# Patient Record
Sex: Male | Born: 1995 | Race: Black or African American | Hispanic: No | Marital: Single | Smoking: Current every day smoker
Health system: Southern US, Community
[De-identification: ages and names within clinical notes are randomized; demographics above are authoritative.]

## PROBLEM LIST (undated history)

## (undated) DIAGNOSIS — B009 Herpesviral infection, unspecified: Secondary | ICD-10-CM

---
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
## 2013-01-19 ENCOUNTER — Emergency Department: Payer: Self-pay | Admitting: Emergency Medicine

## 2014-10-19 DIAGNOSIS — N4889 Other specified disorders of penis: Secondary | ICD-10-CM | POA: Insufficient documentation

## 2015-11-26 ENCOUNTER — Encounter: Payer: Self-pay | Admitting: *Deleted

## 2015-11-26 ENCOUNTER — Ambulatory Visit
Admission: EM | Admit: 2015-11-26 | Discharge: 2015-11-26 | Disposition: A | Discharge status: Home / Self Care | Payer: BLUE CROSS/BLUE SHIELD | Attending: Family Medicine | Admitting: Family Medicine

## 2015-11-26 DIAGNOSIS — N489 Disorder of penis, unspecified: Secondary | ICD-10-CM

## 2015-11-26 DIAGNOSIS — N4889 Other specified disorders of penis: Secondary | ICD-10-CM

## 2015-11-26 MED ORDER — VALACYCLOVIR HCL 1 G PO TABS: 1000.0000 mg | ORAL_TABLET | Freq: Two times a day (BID) | ORAL | Status: DC

## 2015-11-26 NOTE — ED Provider Notes (Signed)
CSN: [id number]     Arrival date & time 11/26/15  1701 History   First MD Initiated Contact with Patient 11/26/15 1812     Chief Complaint  Patient presents with  . Rash   (Consider location/radiation/quality/duration/timing/severity/associated sxs/prior Treatment) Patient is a 20 y.o. male presenting with rash. The history is provided by the patient.  Rash Location:  Ano-genital Ano-genital rash location:  Penis Quality: blistering, redness and scaling   Quality comment:  Initially blistering and redness 6 days ago, but now dry, scaly, scabbed Severity:  Moderate Onset quality:  Sudden Duration:  6 days Timing:  Constant Progression:  Unable to specify Chronicity:  New Ineffective treatments:  None tried   History reviewed. No pertinent past medical history. History reviewed. No pertinent past surgical history. History reviewed. No pertinent family history. Social History  Substance Use Topics  . Smoking status: Current Every Day Smoker  . Smokeless tobacco: Never Used  . Alcohol Use: Yes    Review of Systems  Skin: Positive for rash.    Allergies  Review of patient's allergies indicates no known allergies.  Home Medications   Prior to Admission medications   Medication Sig Start Date End Date Taking? Authorizing Provider  valACYclovir (VALTREX) 1000 MG tablet Take 1 tablet (1,000 mg total) by mouth 2 (two) times daily. 11/26/15   Seth Mccallum, MD   Meds Ordered and Administered this Visit  Medications - No data to display  BP 105/57 mmHg  Pulse 57  Temp(Src) 98.3 F (36.8 C) (Oral)  Resp 18  Ht  (1.727 m)  Wt 185 lb (83.915 kg)  BMI 28.14 kg/m2  SpO2 100% No data found.   Physical Exam  Constitutional: He appears well-developed and well-nourished. No distress.  Genitourinary: Testes normal. No penile erythema. No discharge found.  Dry, scaly, scabbed skin lesions, non-tender at the tip of the penis; no drainage  Skin: He is not diaphoretic.   Nursing note and vitals reviewed.   ED Course  Procedures (including critical care time)  Labs Review Labs Reviewed - No data to display  Imaging Review No results found.   Visual Acuity Review  Right Eye Distance:   Left Eye Distance:   Bilateral Distance:    Right Eye Near:   Left Eye Near:    Bilateral Near:         MDM   1. Penile lesion    Discharge Medication List as of 11/26/2015  6:24 PM    START taking these medications   Details  valACYclovir (VALTREX) 1000 MG tablet Take 1 tablet (1,000 mg total) by mouth 2 (two) times daily., Starting 11/26/2015, Until Discontinued, Normal       1. Diagnosis and possible etiologies reviewed with patient; possible herpes 2. rx as per orders above; reviewed possible side effects, interactions, risks and benefits; empiric treatment 3. Follow-up prn if symptoms worsen or don't improve    Seth Mccallum, MD 11/26/15 2101

## 2015-11-26 NOTE — ED Notes (Signed)
Patient reports a red rash on the right side of his penis that started 5 days ago. Additional symptom includes lower abdominal pressure.

## 2016-01-04 ENCOUNTER — Ambulatory Visit
Admission: EM | Admit: 2016-01-04 | Discharge: 2016-01-04 | Disposition: A | Discharge status: Home / Self Care | Payer: BLUE CROSS/BLUE SHIELD | Attending: Family Medicine | Admitting: Family Medicine

## 2016-01-04 ENCOUNTER — Encounter: Payer: Self-pay | Admitting: *Deleted

## 2016-01-04 DIAGNOSIS — J069 Acute upper respiratory infection, unspecified: Secondary | ICD-10-CM | POA: Diagnosis not present

## 2016-01-04 LAB — RAPID INFLUENZA A&B ANTIGENS
Influenza A (ARMC): NOT DETECTED — AB
Influenza B (ARMC): NOT DETECTED — AB

## 2016-01-04 LAB — RAPID STREP SCREEN (MED CTR MEBANE ONLY): Streptococcus, Group A Screen (Direct): NEGATIVE

## 2016-01-04 NOTE — ED Notes (Signed)
Sore throat, headache, and epigastric pain x2 days. Nausea and vomiting x1 at onset.

## 2016-01-04 NOTE — ED Provider Notes (Signed)
CSN: [id number]     Arrival date & time 01/04/16  1034 History   First MD Initiated Contact with Patient 01/04/16 1329     Chief Complaint  Patient presents with  . Sore Throat  . Headache  . Abdominal Pain   (Consider location/radiation/quality/duration/timing/severity/associated sxs/prior Treatment) Patient is a 20 y.o. male presenting with URI.  URI Presenting symptoms: congestion, cough, rhinorrhea and sore throat   Severity:  Mild Onset quality:  Sudden Duration:  2 days Timing:  Constant Progression:  Unchanged Chronicity:  New Relieved by:  None tried Worsened by:  Nothing tried Ineffective treatments:  None tried Associated symptoms: sneezing   Associated symptoms: no arthralgias, no headaches, no myalgias, no neck pain, no sinus pain and no wheezing   Risk factors: sick contacts   Risk factors: not elderly, no chronic cardiac disease, no chronic kidney disease, no chronic respiratory disease, no diabetes mellitus, no immunosuppression, no recent illness and no recent travel     History reviewed. No pertinent past medical history. History reviewed. No pertinent past surgical history. History reviewed. No pertinent family history. Social History  Substance Use Topics  . Smoking status: Current Every Day Smoker -- 0.50 packs/day    Types: Cigarettes  . Smokeless tobacco: Never Used  . Alcohol Use: Yes    Review of Systems  HENT: Positive for congestion, rhinorrhea, sneezing and sore throat.   Respiratory: Positive for cough. Negative for wheezing.   Musculoskeletal: Negative for myalgias, arthralgias and neck pain.  Neurological: Negative for headaches.    Allergies  Amoxicillin  Home Medications   Prior to Admission medications   Medication Sig Start Date End Date Taking? Authorizing Provider  valACYclovir (VALTREX) 1000 MG tablet Take 1 tablet (1,000 mg total) by mouth 2 (two) times daily. 11/26/15   Payton Mccallum, MD   Meds Ordered and Administered this  Visit  Medications - No data to display  BP 112/57 mmHg  Pulse 51  Temp(Src) 98 F (36.7 C) (Oral)  Resp 16  Ht  (1.753 m)  Wt 185 lb (83.915 kg)  BMI 27.31 kg/m2  SpO2 99% No data found.   Physical Exam  Constitutional: He appears well-developed and well-nourished. No distress.  HENT:  Head: Normocephalic and atraumatic.  Right Ear: Tympanic membrane, external ear and ear canal normal.  Left Ear: Tympanic membrane, external ear and ear canal normal.  Nose: Rhinorrhea present.  Mouth/Throat: Uvula is midline, oropharynx is clear and moist and mucous membranes are normal. No oropharyngeal exudate or tonsillar abscesses.  Eyes: Conjunctivae and EOM are normal. Pupils are equal, round, and reactive to light. Right eye exhibits no discharge. Left eye exhibits no discharge. No scleral icterus.  Neck: Normal range of motion. Neck supple. No tracheal deviation present. No thyromegaly present.  Cardiovascular: Normal rate, regular rhythm and normal heart sounds.   Pulmonary/Chest: Effort normal and breath sounds normal. No stridor. No respiratory distress. He has no wheezes. He has no rales. He exhibits no tenderness.  Lymphadenopathy:    He has no cervical adenopathy.  Neurological: He is alert.  Skin: Skin is warm and dry. No rash noted. He is not diaphoretic.  Nursing note and vitals reviewed.   ED Course  Procedures (including critical care time)  Labs Review Labs Reviewed  RAPID INFLUENZA A&B ANTIGENS ([facility] ONLY) - Abnormal; Notable for the following:    Influenza A [facility]) NOT DETECTED (*)    Influenza B ([facility]) NOT DETECTED (*)    All other components  within normal limits  RAPID STREP SCREEN (NOT AT [facility])  CULTURE, GROUP A STREP [facility])    Imaging Review No results found.   Visual Acuity Review  Right Eye Distance:   Left Eye Distance:   Bilateral Distance:    Right Eye Near:   Left Eye Near:    Bilateral Near:         MDM   1. Viral URI     Discharge Medication List as of 01/04/2016  2:13 PM     1. Lab results and diagnosis reviewed with patient 2. Recommend supportive treatment with fluids, rest, otc meds 3. Follow-up prn if symptoms worsen or don't improve    Payton Mccallum, MD 01/04/16 [phone]

## 2016-01-05 ENCOUNTER — Encounter: Payer: Self-pay | Admitting: *Deleted

## 2016-01-05 ENCOUNTER — Ambulatory Visit (INDEPENDENT_AMBULATORY_CARE_PROVIDER_SITE_OTHER): Payer: BLUE CROSS/BLUE SHIELD

## 2016-01-05 ENCOUNTER — Ambulatory Visit
Admission: EM | Admit: 2016-01-05 | Discharge: 2016-01-05 | Disposition: A | Discharge status: Home / Self Care | Payer: BLUE CROSS/BLUE SHIELD | Attending: Family Medicine | Admitting: Family Medicine

## 2016-01-05 DIAGNOSIS — K5900 Constipation, unspecified: Secondary | ICD-10-CM | POA: Diagnosis not present

## 2016-01-05 HISTORY — DX: Herpesviral infection, unspecified: B00.9

## 2016-01-05 LAB — CBC WITH DIFFERENTIAL/PLATELET
BASOS PCT: 0 %
Basophils Absolute: 0 10*3/uL (ref 0–0.1)
EOS ABS: 0 10*3/uL (ref 0–0.7)
Eosinophils Relative: 1 %
HCT: 45.1 % (ref 40.0–52.0)
HEMOGLOBIN: 15.1 g/dL (ref 13.0–18.0)
Lymphocytes Relative: 22 %
Lymphs Abs: 1.8 10*3/uL (ref 1.0–3.6)
MCH: 30.4 pg (ref 26.0–34.0)
MCHC: 33.5 g/dL (ref 32.0–36.0)
MCV: 90.7 fL (ref 80.0–100.0)
Monocytes Absolute: 0.7 10*3/uL (ref 0.2–1.0)
Monocytes Relative: 8 %
NEUTROS PCT: 69 %
Neutro Abs: 5.6 10*3/uL (ref 1.4–6.5)
PLATELETS: 179 10*3/uL (ref 150–440)
RBC: 4.98 MIL/uL (ref 4.40–5.90)
RDW: 14.1 % (ref 11.5–14.5)
WBC: 8.1 10*3/uL (ref 3.8–10.6)

## 2016-01-05 LAB — MONONUCLEOSIS SCREEN: MONO SCREEN: NEGATIVE

## 2016-01-05 NOTE — ED Provider Notes (Signed)
CSN: [id number]     Arrival date & time 01/05/16  0831 History   First MD Initiated Contact with Patient 01/05/16 0848     Chief Complaint  Patient presents with  . Abdominal Pain   (Consider location/radiation/quality/duration/timing/severity/associated sxs/prior Treatment) HPI: Patient presents today with symptoms of generalized abdominal discomfort. Patient states that he has had the symptoms for the last few days. He was seen here yesterday with symptoms of headache and sore throat. He did have a flu test and rapid strep test done in the office which were negative. He denies any fevers. He does state that his diet is poor and that he does drink alcohol on occasion and drinks mostly soda and juice. He has a bowel movement once every 2 days. He denies any vomiting, nausea, diarrhea, melena, BRBPR. He denies taking any medications. He has not been taking NSAIDs.  No past medical history on file. No past surgical history on file. No family history on file. Social History  Substance Use Topics  . Smoking status: Current Every Day Smoker -- 0.50 packs/day    Types: Cigarettes  . Smokeless tobacco: Never Used  . Alcohol Use: Yes    Review of Systems: Negative except mentioned above.   Allergies  Amoxicillin  Home Medications   Prior to Admission medications   Medication Sig Start Date End Date Taking? Authorizing Provider  valACYclovir (VALTREX) 1000 MG tablet Take 1 tablet (1,000 mg total) by mouth 2 (two) times daily. 11/26/15   Payton Mccallum, MD   Meds Ordered and Administered this Visit  Medications - No data to display  There were no vitals taken for this visit. No data found.   Physical Exam:   GENERAL: NAD HEENT: mild pharyngeal erythema, no exudate, no erythema of TMs, no significant cervical LAD RESP: CTA B CARD: RRR ABD: generalized abdominal discomfort, no rebound or guarding  NEURO: CN II-XII grossly intact   ED Course  Procedures (including critical care  time)  Labs Review Labs Reviewed - No data to display  Imaging Review No results found.   MDM  A/P: Constipation- discussed results of Xray with patient, labs normal, magnesium citrate 1-2x, encouraged dietary changes, increase water intake, f/u with PMD if symptoms persist or worsen.     Jolene Provost, MD 01/05/16 1140

## 2016-01-05 NOTE — ED Notes (Signed)
Patient started having stomach pain 4 days ago. Patient reports stomach pain has become worse since yesterday.

## 2016-01-05 NOTE — Discharge Instructions (Signed)
Try Magnesium Citrate for 1-2 days then stay on stool softner. Drink more water.

## 2016-01-06 LAB — CULTURE, GROUP A STREP (THRC)

## 2016-02-11 ENCOUNTER — Encounter: Payer: Self-pay | Admitting: Emergency Medicine

## 2016-02-11 ENCOUNTER — Ambulatory Visit
Admission: EM | Admit: 2016-02-11 | Discharge: 2016-02-11 | Disposition: A | Discharge status: Home / Self Care | Payer: BLUE CROSS/BLUE SHIELD | Attending: Family Medicine | Admitting: Family Medicine

## 2016-02-11 DIAGNOSIS — F419 Anxiety disorder, unspecified: Secondary | ICD-10-CM

## 2016-02-11 MED ORDER — LORAZEPAM 1 MG PO TABS: ORAL_TABLET | ORAL | Status: DC

## 2016-02-11 MED ORDER — SERTRALINE HCL 50 MG PO TABS: 50.0000 mg | ORAL_TABLET | Freq: Every day | ORAL | Status: DC

## 2016-02-11 NOTE — ED Provider Notes (Signed)
CSN: [id number]     Arrival date & time 02/11/16  1119 History   First MD Initiated Contact with Patient 02/11/16 1309     Chief Complaint  Patient presents with  . Anxiety   (Consider location/radiation/quality/duration/timing/severity/associated sxs/prior Treatment) HPI Comments: 20 yo male with a complaint of anxiety which has been worse recently due to increased stressors, causing some anxiety attacks with palpitations, increased breathing, sweating. Denies any suicidal or homicidal ideation. Does not have a PCP.   Patient is a 20 y.o. male presenting with anxiety. The history is provided by the patient.  Anxiety    Past Medical History  Diagnosis Date  . Herpes    History reviewed. No pertinent past surgical history. No family history on file. Social History  Substance Use Topics  . Smoking status: Current Every Day Smoker -- 0.50 packs/day    Types: Cigarettes  . Smokeless tobacco: Never Used  . Alcohol Use: Yes    Review of Systems  Allergies  Amoxicillin  Home Medications   Prior to Admission medications   Medication Sig Start Date End Date Taking? Authorizing Provider  LORazepam (ATIVAN) 1 MG tablet 1 tab po qd prn severe anxiety 02/11/16   Payton Mccallumrlando Rianne Degraaf, MD  sertraline (ZOLOFT) 50 MG tablet Take 1 tablet (50 mg total) by mouth daily. 02/11/16   Payton Mccallumrlando Tyrone Pautsch, MD  valACYclovir (VALTREX) 1000 MG tablet Take 1 tablet (1,000 mg total) by mouth 2 (two) times daily. 11/26/15   Payton Mccallumrlando Ainslee Sou, MD   Meds Ordered and Administered this Visit  Medications - No data to display  BP 111/67 mmHg  Pulse 60  Temp(Src) 96.3 F (35.7 C) (Oral)  Resp 20  Ht 5\' 9"  (1.753 m)  Wt 176 lb (79.833 kg)  BMI 25.98 kg/m2  SpO2 99% No data found.   Physical Exam  Constitutional: He is oriented to person, place, and time. He appears well-developed and well-nourished. No distress.  Neurological: He is alert and oriented to person, place, and time. No cranial nerve deficit.  Skin: He is  not diaphoretic.  Psychiatric: He has a normal mood and affect. His behavior is normal. Judgment and thought content normal.  Nursing note and vitals reviewed.   ED Course  Procedures (including critical care time)  Labs Review Labs Reviewed - No data to display  Imaging Review No results found.   Visual Acuity Review  Right Eye Distance:   Left Eye Distance:   Bilateral Distance:    Right Eye Near:   Left Eye Near:    Bilateral Near:         MDM   1. Anxiety    Discharge Medication List as of 02/11/2016  1:29 PM    START taking these medications   Details  LORazepam (ATIVAN) 1 MG tablet 1 tab po qd prn severe anxiety, Print    sertraline (ZOLOFT) 50 MG tablet Take 1 tablet (50 mg total) by mouth daily., Starting 02/11/2016, Until Discontinued, Normal       1. diagnosis reviewed with patient 2. rx as per orders above; reviewed possible side effects, interactions, risks and benefits  3. Recommend establish care with PCP to continue medication management 4. Follow-up prn if symptoms worsen or don't improve    Payton Mccallumrlando Haelyn Forgey, MD 02/11/16 2113

## 2016-02-11 NOTE — ED Notes (Addendum)
Pt presents with decreased appetite, body aches and feelings of anxiety for the past three days. Pt has been working a lot without eating. . Pt denies any thoughts SI or HI at this time. Pt has been seen in the past for anxiety, but has never taken any meds for same. No acute distress noted. Pt states he feels like he cannot relax and feels tense all time.

## 2016-02-22 ENCOUNTER — Encounter: Payer: Self-pay | Admitting: *Deleted

## 2016-02-22 ENCOUNTER — Ambulatory Visit
Admission: EM | Admit: 2016-02-22 | Discharge: 2016-02-22 | Disposition: A | Discharge status: Home / Self Care | Payer: BLUE CROSS/BLUE SHIELD | Attending: Family Medicine | Admitting: Family Medicine

## 2016-02-22 ENCOUNTER — Ambulatory Visit (INDEPENDENT_AMBULATORY_CARE_PROVIDER_SITE_OTHER): Payer: BLUE CROSS/BLUE SHIELD

## 2016-02-22 DIAGNOSIS — S2020XA Contusion of thorax, unspecified, initial encounter: Secondary | ICD-10-CM

## 2016-02-22 MED ORDER — KETOROLAC TROMETHAMINE 60 MG/2ML IM SOLN
60.0000 mg | Freq: Once | INTRAMUSCULAR | Status: AC
  Administered 2016-02-22: 60 mg via INTRAMUSCULAR

## 2016-02-22 MED ORDER — MELOXICAM 15 MG PO TABS: 15.0000 mg | ORAL_TABLET | Freq: Every day | ORAL | Status: DC
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
## 2016-02-22 NOTE — Discharge Instructions (Signed)
Contusion A contusion is a deep bruise. Contusions happen when an injury causes bleeding under the skin. Symptoms of bruising include pain, swelling, and discolored skin. The skin may turn blue, purple, or yellow. HOME CARE   Rest the injured area.  If told, put ice on the injured area.  Put ice in a plastic bag.  Place a towel between your skin and the bag.  Leave the ice on for 20 minutes, 2-3 times per day.  If told, put light pressure (compression) on the injured area using an elastic bandage. Make sure the bandage is not too tight. Remove it and put it back on as told by your doctor.  If possible, raise (elevate) the injured area above the level of your heart while you are sitting or lying down.  Take over-the-counter and prescription medicines only as told by your doctor. GET HELP IF:  Your symptoms do not get better after several days of treatment.  Your symptoms get worse.  You have trouble moving the injured area. GET HELP RIGHT AWAY IF:   You have very bad pain.  You have a loss of feeling (numbness) in a hand or foot.  Your hand or foot turns pale or cold.   This information is not intended to replace advice given to you by your health care provider. Make sure you discuss any questions you have with your health care provider.   Document Released: 04/10/2008 Document Revised: 07/14/2015 Document Reviewed: 03/10/2015 Elsevier Interactive Patient Education 2016 [employer]  Cryotherapy Cryotherapy is when you put ice on your injury. Ice helps lessen pain and puffiness (swelling) after an injury. Ice works the best when you start using it in the first 24 to 48 hours after an injury. HOME CARE  Put a dry or damp towel between the ice pack and your skin.  You may press gently on the ice pack.  Leave the ice on for no more than 10 to 20 minutes at a time.  Check your skin after 5 minutes to make sure your skin is okay.  Rest at least 20 minutes between ice  pack uses.  Stop using ice when your skin loses feeling (numbness).  Do not use ice on someone who cannot tell you when it hurts. This includes small children and people with memory problems (dementia). GET HELP RIGHT AWAY IF:  You have white spots on your skin.  Your skin turns blue or pale.  Your skin feels waxy or hard.  Your puffiness gets worse. MAKE SURE YOU:   Understand these instructions.  Will watch your condition.  Will get help right away if you are not doing well or get worse.   This information is not intended to replace advice given to you by your health care provider. Make sure you discuss any questions you have with your health care provider.   Document Released: 04/10/2008 Document Revised: 01/15/2012 Document Reviewed: 06/15/2011 Elsevier Interactive Patient Education 2016 [employer]  Rib Contusion A rib contusion is a deep bruise on your rib area. Contusions are the result of a blunt trauma that causes bleeding and injury to the tissues under the skin. A rib contusion may involve bruising of the ribs and of the skin and muscles in the area. The skin overlying the contusion may turn blue, purple, or yellow. Minor injuries will give you a painless contusion, but more severe contusions may stay painful and swollen for a few weeks. CAUSES  A contusion is usually caused by a  blow, trauma, or direct force to an area of the body. This often occurs while playing contact sports. SYMPTOMS  Swelling and redness of the injured area.  Discoloration of the injured area.  Tenderness and soreness of the injured area.  Pain with or without movement. DIAGNOSIS  The diagnosis can be made by taking a medical history and performing a physical exam. An X-ray, CT scan, or MRI may be needed to determine if there were any associated injuries, such as broken bones (fractures) or internal injuries. TREATMENT  Often, the best treatment for a rib contusion is rest. Icing or  applying cold compresses to the injured area may help reduce swelling and inflammation. Deep breathing exercises may be recommended to reduce the risk of partial lung collapse and pneumonia. Over-the-counter or prescription medicines may also be recommended for pain control. HOME CARE INSTRUCTIONS   Apply ice to the injured area:  Put ice in a plastic bag.  Place a towel between your skin and the bag.  Leave the ice on for 20 minutes, 2-3 times per day.  Take medicines only as directed by your health care provider.  Rest the injured area. Avoid strenuous activity and any activities or movements that cause pain. Be careful during activities and avoid bumping the injured area.  Perform deep-breathing exercises as directed by your health care provider.  Do not lift anything that is heavier than 5 lb (2.3 kg) until your health care provider approves.  Do not use any tobacco products, including cigarettes, chewing tobacco, or electronic cigarettes. If you need help quitting, ask your health care provider. SEEK MEDICAL CARE IF:   You have increased bruising or swelling.  You have pain that is not controlled with treatment.  You have a fever. SEEK IMMEDIATE MEDICAL CARE IF:   You have difficulty breathing or shortness of breath.  You develop a continual cough, or you cough up thick or bloody sputum.  You feel sick to your stomach (nauseous), you throw up (vomit), or you have abdominal pain.   This information is not intended to replace advice given to you by your health care provider. Make sure you discuss any questions you have with your health care provider.   Document Released: 07/18/2001 Document Revised: 11/13/2014 Document Reviewed: 08/04/2014 Elsevier Interactive Patient Education 2016 [employer].  General Assault Assault includes any behavior or physical attack--whether it is on purpose or not--that results in injury to another person, damage to property, or both. This  also includes assault that has not yet happened, but is planned to happen. Threats of assault may be physical, verbal, or written. They may be said or sent by:  Mail.  E-mail.  Text.  Social media.  Fax. The threats may be direct, implied, or understood. WHAT ARE THE DIFFERENT FORMS OF ASSAULT? Forms of assault include:  Physically assaulting a person. This includes physical threats to inflict physical harm as well as:  Slapping.  Hitting.  Poking.  Kicking.  Punching.  Pushing.  Sexually assaulting a person. Sexual assault is any sexual activity that a person is forced, threatened, or coerced to participate in. It may or may not involve physical contact with the person who is assaulting you. You are sexually assaulted if you are forced to have sexual contact of any kind.  Damaging or destroying a person's assistive equipment, such as glasses, canes, or walkers.  Throwing or hitting objects.  Using or displaying a weapon to harm or threaten someone.  Using or displaying an  object that appears to be a weapon in a threatening manner.  Using greater physical size or strength to intimidate someone.  Making intimidating or threatening gestures.  Bullying.  Hazing.  Using language that is intimidating, threatening, hostile, or abusive.  Stalking.  Restraining someone with force. WHAT SHOULD I DO IF I EXPERIENCE ASSAULT?  Report assaults, threats, and stalking to the police. Call your local emergency services (911 in the [place] if you are in immediate danger or you need medical help.  You can work with a Clinical research associate or an advocate to get legal protection against someone who has assaulted you or threatened you with assault. Protection includes restraining orders and private addresses. Crimes against you, such as assault, can also be prosecuted through the courts. Laws will vary depending on where you live.   This information is not intended to replace advice given to you  by your health care provider. Make sure you discuss any questions you have with your health care provider.   Document Released: 10/23/2005 Document Revised: 11/13/2014 Document Reviewed: 07/10/2014 Elsevier Interactive Patient Education 2016 [employer] Inc.  Blunt Chest Trauma Blunt chest trauma is an injury caused by a blow to the chest. These chest injuries can be very painful. Blunt chest trauma often results in bruised or broken (fractured) ribs. Most cases of bruised and fractured ribs from blunt chest traumas get better after 1 to 3 weeks of rest and pain medicine. Often, the soft tissue in the chest wall is also injured, causing pain and bruising. Internal organs, such as the heart and lungs, may also be injured. Blunt chest trauma can lead to serious medical problems. This injury requires immediate medical care. CAUSES   Motor vehicle collisions.  Falls.  Physical violence.  Sports injuries. SYMPTOMS   Chest pain. The pain may be worse when you move or breathe deeply.  Shortness of breath.  Lightheadedness.  Bruising.  Tenderness.  Swelling. DIAGNOSIS  Your caregiver will do a physical exam. X-rays may be taken to look for fractures. However, minor rib fractures may not show up on X-rays until a few days after the injury. If a more serious injury is suspected, further imaging tests may be done. This may include ultrasounds, computed tomography (CT) scans, or magnetic resonance imaging (MRI). TREATMENT  Treatment depends on the severity of your injury. Your caregiver may prescribe pain medicines and deep breathing exercises. HOME CARE INSTRUCTIONS  Limit your activities until you can move around without much pain.  Do not do any strenuous work until your injury is healed.  Put ice on the injured area.  Put ice in a plastic bag.  Place a towel between your skin and the bag.  Leave the ice on for 15-20 minutes, 03-04 times a day.  You may wear a rib belt as directed  by your caregiver to reduce pain.  Practice deep breathing as directed by your caregiver to keep your lungs clear.  Only take over-the-counter or prescription medicines for pain, fever, or discomfort as directed by your caregiver. SEEK IMMEDIATE MEDICAL CARE IF:   You have increasing pain or shortness of breath.  You cough up blood.  You have nausea, vomiting, or abdominal pain.  You have a fever.  You feel dizzy, weak, or you faint. MAKE SURE YOU:  Understand these instructions.  Will watch your condition.  Will get help right away if you are not doing well or get worse.   This information is not intended to replace advice given to  you by your health care provider. Make sure you discuss any questions you have with your health care provider.   Document Released: 11/30/2004 Document Revised: 11/13/2014 Document Reviewed: 04/21/2015 Elsevier Interactive Patient Education [employer]

## 2016-05-15 ENCOUNTER — Encounter: Payer: Self-pay | Admitting: Emergency Medicine

## 2016-05-15 DIAGNOSIS — W57XXXA Bitten or stung by nonvenomous insect and other nonvenomous arthropods, initial encounter: Secondary | ICD-10-CM | POA: Diagnosis not present

## 2016-05-15 DIAGNOSIS — Y929 Unspecified place or not applicable: Secondary | ICD-10-CM | POA: Insufficient documentation

## 2016-05-15 DIAGNOSIS — Y999 Unspecified external cause status: Secondary | ICD-10-CM | POA: Diagnosis not present

## 2016-05-15 DIAGNOSIS — Z79899 Other long term (current) drug therapy: Secondary | ICD-10-CM | POA: Diagnosis not present

## 2016-05-15 DIAGNOSIS — S70361A Insect bite (nonvenomous), right thigh, initial encounter: Secondary | ICD-10-CM | POA: Insufficient documentation

## 2016-05-15 DIAGNOSIS — R1010 Upper abdominal pain, unspecified: Secondary | ICD-10-CM | POA: Diagnosis not present

## 2016-05-15 DIAGNOSIS — G8929 Other chronic pain: Secondary | ICD-10-CM | POA: Diagnosis not present

## 2016-05-15 DIAGNOSIS — F17210 Nicotine dependence, cigarettes, uncomplicated: Secondary | ICD-10-CM | POA: Insufficient documentation

## 2016-05-15 DIAGNOSIS — Y939 Activity, unspecified: Secondary | ICD-10-CM | POA: Insufficient documentation

## 2016-05-15 NOTE — ED Notes (Addendum)
Pt presents to ED with possible spider bite to right thigh. Pt states he thinks it was a spider bite because he has "all the symptoms of one". C/o itching to reddened area, feeling anxious, nausea, and chest / abd tightness. Vomiting yesterday. Small red area noted to thigh. No drainage or redness radiating from affected site.

## 2016-05-16 ENCOUNTER — Emergency Department
Admission: EM | Admit: 2016-05-16 | Discharge: 2016-05-16 | Disposition: A | Discharge status: Home / Self Care | Payer: BLUE CROSS/BLUE SHIELD | Attending: Emergency Medicine | Admitting: Emergency Medicine

## 2016-05-16 DIAGNOSIS — W57XXXA Bitten or stung by nonvenomous insect and other nonvenomous arthropods, initial encounter: Secondary | ICD-10-CM

## 2016-05-16 DIAGNOSIS — R109 Unspecified abdominal pain: Secondary | ICD-10-CM

## 2016-05-16 MED ORDER — SUCRALFATE 1 G PO TABS: 1.0000 g | ORAL_TABLET | Freq: Four times a day (QID) | ORAL | Status: DC

## 2016-05-16 MED ORDER — RANITIDINE HCL 150 MG PO TABS: 150.0000 mg | ORAL_TABLET | Freq: Every day | ORAL | Status: DC

## 2016-05-16 MED ORDER — GI COCKTAIL ~~LOC~~
30.0000 mL | Freq: Once | ORAL | Status: AC
Start: 2016-05-16 — End: 2016-05-16
  Administered 2016-05-16: 30 mL via ORAL
  Filled 2016-05-16: qty 30

## 2016-05-16 NOTE — ED Provider Notes (Signed)
[facility] Emergency Department Provider Note    ____________________________________________  Time seen: ~0140  I have reviewed the triage vital signs and the nursing notes.   HISTORY  Chief Complaint Abdominal pain, insect bite  History limited by: Not Limited   HPI Seth Murphy is a 20 y.o. male who presents to the emergency department today with primary complaint of abdominal pain. He states it hurts in the upper abdomen. The patient says that he has had pain there for the past 3 years. He says that he has been prescribed antibiotics for it in the past when he has seen doctors. He does not have a primary care doctor. He says that additionally he will have pain radiating up into his chest. He describes this as being tight. He also complains of an insect bite to his right inner thigh. This is pruritic. Patient denies any recent fevers.   Past Medical History  Diagnosis Date  . Herpes     There are no active problems to display for this patient.   History reviewed. No pertinent past surgical history.  Current Outpatient Rx  Name  Route  Sig  Dispense  Refill  . LORazepam (ATIVAN) 1 MG tablet      1 tab po qd prn severe anxiety   4 tablet   0   . meloxicam (MOBIC) 15 MG tablet   Oral   Take 1 tablet (15 mg total) by mouth daily.   30 tablet   1   . sertraline (ZOLOFT) 50 MG tablet   Oral   Take 1 tablet (50 mg total) by mouth daily.   21 tablet   0   . valACYclovir (VALTREX) 1000 MG tablet   Oral   Take 1 tablet (1,000 mg total) by mouth 2 (two) times daily.   14 tablet   0     Allergies Amoxicillin  History reviewed. No pertinent family history.  Social History Social History  Substance Use Topics  . Smoking status: Current Every Day Smoker -- 0.50 packs/day    Types: Cigarettes  . Smokeless tobacco: Never Used  . Alcohol Use: Yes    Review of Systems  Constitutional: Negative for fever. Cardiovascular: Positive  for chest pain. Respiratory: Negative for shortness of breath. Gastrointestinal: Positive for upper abdominal pain Neurological: Negative for headaches, focal weakness or numbness.  10-point ROS otherwise negative.  ____________________________________________   PHYSICAL EXAM:  VITAL SIGNS: ED Triage Vitals  Enc Vitals Group     BP 05/15/16 2238 120/67 mmHg     Pulse Rate 05/15/16 2238 58     Resp 05/15/16 2238 18     Temp 05/15/16 2238 98 F (36.7 C)     Temp Source 05/15/16 2238 Oral     SpO2 05/15/16 2238 100 %     Weight 05/15/16 2238 180 lb (81.647 kg)     Height 05/15/16 2238  (1.778 m)     Head Cir --      Peak Flow --      Pain Score 05/15/16 2239 10   Constitutional: Alert and oriented. Well appearing and in no distress. Eyes: Conjunctivae are normal. PERRL. Normal extraocular movements. ENT   Head: Normocephalic and atraumatic.   Nose: No congestion/rhinnorhea.   Mouth/Throat: Mucous membranes are moist.   Neck: No stridor. Hematological/Lymphatic/Immunilogical: No cervical lymphadenopathy. Cardiovascular: Normal rate, regular rhythm.  No murmurs, rubs, or gallops. Respiratory: Normal respiratory effort without tachypnea nor retractions. Breath sounds are clear and  equal bilaterally. No wheezes/rales/rhonchi. Gastrointestinal: Soft and nontender. No distention. There is no CVA tenderness. Genitourinary: Deferred Musculoskeletal: Normal range of motion in all extremities. No joint effusions.  No lower extremity tenderness nor edema. Neurologic:  Normal speech and language. No gross focal neurologic deficits are appreciated.  Skin:  Skin is warm, dry and intact. No rash noted. Psychiatric: Mood and affect are normal. Speech and behavior are normal. Patient exhibits appropriate insight and judgment.  ____________________________________________    LABS (pertinent  positives/negatives)  None  ____________________________________________   EKG  I, Phineas SemenGraydon Mililani Murthy, attending physician, personally viewed and interpreted this EKG  EKG Time: 0136 Rate: 52 Rhythm: sinus bradycardia Axis: normal Intervals: qtc 379 QRS: narrow ST changes: no st elevation Impression: sinus bradycardia  ____________________________________________    RADIOLOGY  None  ____________________________________________   PROCEDURES  Procedure(s) performed: None  Critical Care performed: No  ____________________________________________   INITIAL IMPRESSION / ASSESSMENT AND PLAN / ED COURSE  Pertinent labs & imaging results that were available during my care of the patient were reviewed by me and considered in my medical decision making (see chart for details).  She presented to the emergency department today. Patient's primary complaint to me was for abdominal pain. This is chronic. He is been going on for 3 years. States it is somewhat worse in the upper abdomen and in his chest. He was given a GI cocktail which she says did start to help relieve the symptoms. This point I think gastritis and Clearance CootsHarper unlikely. Will plan on giving him prescription for medications and primary care follow-up.  ____________________________________________   FINAL CLINICAL IMPRESSION(S) / ED DIAGNOSES  Final diagnoses:  Insect bite  Abdominal pain, unspecified abdominal location     Note: This dictation was prepared with Dragon dictation. Any transcriptional errors that result from this process are unintentional    Phineas SemenGraydon Aloria Looper, MD 05/16/16 [id number]

## 2016-05-16 NOTE — Discharge Instructions (Signed)
Please seek medical attention for any high fevers, chest pain, shortness of breath, change in behavior, persistent vomiting, bloody stool or any other new or concerning symptoms. ° ° °Gastritis, Adult °Gastritis is soreness and puffiness (inflammation) of the lining of the stomach. If you do not get help, gastritis can cause bleeding and sores (ulcers) in the stomach. °HOME CARE  °· Only take medicine as told by your doctor. °· If you were given antibiotic medicines, take them as told. Finish the medicines even if you start to feel better. °· Drink enough fluids to keep your pee (urine) clear or pale yellow. °· Avoid foods and drinks that make your problems worse. Foods you may want to avoid include: °¨ Caffeine or alcohol. °¨ Chocolate. °¨ Mint. °¨ Garlic and onions. °¨ Spicy foods. °¨ Citrus fruits, including oranges, lemons, or limes. °¨ Food containing tomatoes, including sauce, chili, salsa, and pizza. °¨ Fried and fatty foods. °· Eat small meals throughout the day instead of large meals. °GET HELP RIGHT AWAY IF:  °· You have black or dark red poop (stools). °· You throw up (vomit) blood. It may look like coffee grounds. °· You cannot keep fluids down. °· Your belly (abdominal) pain gets worse. °· You have a fever. °· You do not feel better after 1 week. °· You have any other questions or concerns. °MAKE SURE YOU:  °· Understand these instructions. °· Will watch your condition. °· Will get help right away if you are not doing well or get worse. °  °This information is not intended to replace advice given to you by your health care provider. Make sure you discuss any questions you have with your health care provider. °  °Document Released: 04/10/2008 Document Revised: 01/15/2012 Document Reviewed: 12/06/2011 °Elsevier Interactive Patient Education ©2016 Elsevier Inc. ° °

## 2016-05-16 NOTE — ED Notes (Signed)
Pt discharged to home.  Discharge instructions reviewed.  Verbalized understanding.  No questions or concerns at this time.  Teach back verified.  Pt in NAD.  No items left in ED.   

## 2016-06-08 ENCOUNTER — Ambulatory Visit (INDEPENDENT_AMBULATORY_CARE_PROVIDER_SITE_OTHER): Payer: BLUE CROSS/BLUE SHIELD

## 2016-06-08 ENCOUNTER — Ambulatory Visit
Admission: EM | Admit: 2016-06-08 | Discharge: 2016-06-08 | Disposition: A | Discharge status: Home / Self Care | Payer: BLUE CROSS/BLUE SHIELD | Attending: Family Medicine | Admitting: Family Medicine

## 2016-06-08 ENCOUNTER — Encounter: Payer: Self-pay | Admitting: *Deleted

## 2016-06-08 DIAGNOSIS — R109 Unspecified abdominal pain: Secondary | ICD-10-CM

## 2016-06-08 LAB — CBC WITH DIFFERENTIAL/PLATELET
BASOS ABS: 0 10*3/uL (ref 0–0.1)
Basophils Relative: 0 %
EOS ABS: 0.1 10*3/uL (ref 0–0.7)
EOS PCT: 1 %
HEMATOCRIT: 43 % (ref 40.0–52.0)
HEMOGLOBIN: 14.1 g/dL (ref 13.0–18.0)
LYMPHS ABS: 1.7 10*3/uL (ref 1.0–3.6)
Lymphocytes Relative: 26 %
MCH: 29.5 pg (ref 26.0–34.0)
MCHC: 32.8 g/dL (ref 32.0–36.0)
MCV: 89.9 fL (ref 80.0–100.0)
MONOS PCT: 8 %
Monocytes Absolute: 0.5 10*3/uL (ref 0.2–1.0)
Neutro Abs: 4.2 10*3/uL (ref 1.4–6.5)
Neutrophils Relative %: 65 %
Platelets: 181 10*3/uL (ref 150–440)
RBC: 4.78 MIL/uL (ref 4.40–5.90)
RDW: 13.8 % (ref 11.5–14.5)
WBC: 6.5 10*3/uL (ref 3.8–10.6)

## 2016-06-08 LAB — COMPREHENSIVE METABOLIC PANEL
ALBUMIN: 4.7 g/dL (ref 3.5–5.0)
ALK PHOS: 65 U/L (ref 38–126)
ALT: 19 U/L (ref 17–63)
AST: 18 U/L (ref 15–41)
Anion gap: 7 (ref 5–15)
BILIRUBIN TOTAL: 2.2 mg/dL — AB (ref 0.3–1.2)
BUN: 10 mg/dL (ref 6–20)
CALCIUM: 9.2 mg/dL (ref 8.9–10.3)
CO2: 26 mmol/L (ref 22–32)
Chloride: 104 mmol/L (ref 101–111)
Creatinine, Ser: 1.01 mg/dL (ref 0.61–1.24)
GFR calc Af Amer: >60 mL/min (ref >60)
GFR calc non Af Amer: >60 mL/min (ref >60)
GLUCOSE: 92 mg/dL (ref 65–99)
Potassium: 3.5 mmol/L (ref 3.5–5.1)
Sodium: 137 mmol/L (ref 135–145)
TOTAL PROTEIN: 7.8 g/dL (ref 6.5–8.1)

## 2016-06-08 LAB — CHLAMYDIA/NGC RT PCR (ARMC ONLY)
Chlamydia Tr: NOT DETECTED
N gonorrhoeae: NOT DETECTED

## 2016-06-08 LAB — URINALYSIS COMPLETE WITH MICROSCOPIC (ARMC ONLY)
BACTERIA UA: NONE SEEN
Bilirubin Urine: NEGATIVE
Glucose, UA: NEGATIVE mg/dL
HGB URINE DIPSTICK: NEGATIVE
Ketones, ur: NEGATIVE mg/dL
Leukocytes, UA: NEGATIVE
NITRITE: NEGATIVE
PROTEIN: NEGATIVE mg/dL
SPECIFIC GRAVITY, URINE: 1.02 (ref 1.005–1.030)
pH: 7 (ref 5.0–8.0)

## 2016-06-08 LAB — LIPASE, BLOOD: Lipase: 22 U/L (ref 11–51)

## 2016-06-08 MED ORDER — OMEPRAZOLE 20 MG PO CPDR: 20.0000 mg | DELAYED_RELEASE_CAPSULE | Freq: Every day | ORAL | 0 refills | Status: DC

## 2016-06-08 MED ORDER — OXYCODONE-ACETAMINOPHEN 5-325 MG PO TABS: 0.5000 | ORAL_TABLET | Freq: Three times a day (TID) | ORAL | 0 refills | Status: DC | PRN

## 2016-06-08 NOTE — Discharge Instructions (Signed)
Take medication as prescribed. Rest. Drink plenty of fluids.  ° °Follow up with your primary care physician this week as needed. Return to Urgent care for new or worsening concerns.  ° °

## 2016-06-08 NOTE — ED Triage Notes (Signed)
Intermittent dysuria, generalized abd pain, right flank pain, intermittent nausea and a panic attack 2 days ago.

## 2016-06-08 NOTE — ED Provider Notes (Signed)
[facility] ____________________________________________  Time seen: Approximately 9:06 AM  I have reviewed the triage vital signs and the nursing notes.   HISTORY  Chief Complaint Abdominal Pain; Flank Pain; Generalized Body Aches   HPI Seth Murphy is a 20 y.o. male presents with a complaint of abdominal pain. Patient reports over the last 3 weeks he has had intermittent generalized abdominal pain that has some variation around in location but states usually more so in the epigastric area. Patient also reports right flank pain that has been present most of the time and describes this as "my muscle feels tight." Patient reports the pain in his abdomen comes and goes, but overall has present on most days. Patient does also report that he occasionally would have urinary frequency but denies this in the last few days. Denies any history of similar in the past. Denies any recent changes of medications, foods or known triggers. Denies any changes in pain over the last 3 weeks, states no worsening. Denies known triggers. Denies changes of pain with foods or fluids.Denies fevers.   Patient denies any penile discharge, penile or testicular complaints or pain or swelling. Patient reports he sexually active with one partner and reports is the same partner for several months. Denies concerns of STDs.   Patient reports he currently has mild abdominal pain in the epigastric area.  reports last bowel movement was yesterday and described as normal. Denies any recent constipation or diarrhea. Patient denies any blood in urine, blood in stool, dark stool, vomiting, nausea, fevers, decreased oral intake. Reports continues to eat and drink well with normal appetite. Denies frequent burping, denies indigestion feelings. Reports continues to pass gas normally. Denies fall or trauma. Patient reports he use to drink alcohol "a lot "but states he has not drank any alcohol in the last 2-3 months. Denies  drug use. Patient reports he has continued to remain active throughout his abdominal pain complaints. Denies taking any medications on a daily basis.  Patient states he currently does not have a primary physician but states that his he and his grandmother are in the process of establishing a PCP through [facility].   Past Medical History:  Diagnosis Date  . Herpes     There are no active problems to display for this patient.   History reviewed. No pertinent surgical history.  No current facility-administered medications for this encounter.   Current Outpatient Prescriptions:  .  valACYclovir (VALTREX) 1000 MG tablet, Take 1 tablet (1,000 mg total) by mouth 2 (two) times daily., Disp: 14 tablet, Rfl: 0  Allergies Amoxicillin  family history Uncle: kidney stones  Social History Social History  Substance Use Topics  . Smoking status: Former Smoker    Packs/day: 0.50    Types: Cigarettes  . Smokeless tobacco: Never Used  . Alcohol use Yes    Review of Systems Constitutional: No fever/chills Eyes: No visual changes. ENT: No sore throat. Cardiovascular: Denies chest pain. Respiratory: Denies shortness of breath. Gastrointestinal: As above.  No nausea, no vomiting.  No diarrhea.  No constipation. Genitourinary: Negative for dysuria. Musculoskeletal: Negative for back pain. Skin: Negative for rash. Neurological: Negative for headaches, focal weakness or numbness.  10-point ROS otherwise negative.  ____________________________________________   PHYSICAL EXAM:  VITAL SIGNS: ED Triage Vitals  Enc Vitals Group     BP 06/08/16 0836 108/66     Pulse Rate 06/08/16 0836 (!) 50 Recheck 64     Resp 06/08/16 0836 16  Temp 06/08/16 0836 97.7 F (36.5 C)     Temp Source 06/08/16 0836 Oral     SpO2 06/08/16 0836 100 %     Weight 06/08/16 0836 190 lb (86.2 kg)     Height 06/08/16 0836  (1.778 m)     Head Circumference --      Peak Flow --      Pain Score 06/08/16 0845  10     Pain Loc --      Pain Edu? --      Excl. in GC? --     Constitutional: Alert and oriented. Well appearing and in no acute distress. Eyes: Conjunctivae are normal. PERRL. EOMI. ENT      Head: Normocephalic and atraumatic.      Nose: No congestion/rhinnorhea.      Mouth/Throat: Mucous membranes are moist.Oropharynx non-erythematous. Neck: No stridor. Supple without meningismus.  Hematological/Lymphatic/Immunilogical: No cervical lymphadenopathy. Cardiovascular: Normal rate, regular rhythm. Grossly normal heart sounds.  Good peripheral circulation. Respiratory: Normal respiratory effort without tachypnea nor retractions. Breath sounds are clear and equal bilaterally. No wheezes/rales/rhonchi.. Gastrointestinal: Mild epigastric tenderness midline, abdomen otherwise soft and nontender. No RLQ or LLQ tenderness. Negative Rovsing sign. Negative obturator sign. No masses palpated. Non-guarding. Normal Bowel sounds. No left CVA tenderness. Mild right CVA tenderness, but mild tenderness also along posterior latissimus dorsi with pain reproducible with overhead stretching. Changes positions from lying to sitting and twisting quickly with use of abdominal musculature in no distress.  Musculoskeletal:  Nontender with normal range of motion in all extremities. No midline cervical, thoracic or lumbar tenderness to palpation. Neurologic:  Normal speech and language. No gross focal neurologic deficits are appreciated. Speech is normal. No gait instability.  Skin:  Skin is warm, dry and intact. No rash noted. Psychiatric: Mood and affect are normal. Speech and behavior are normal. Patient exhibits appropriate insight and judgment   ___________________________________________   LABS (all labs ordered are listed, but only abnormal results are displayed)  Labs Reviewed  URINALYSIS COMPLETEWITH MICROSCOPIC ([facility] ONLY) - Abnormal; Notable for the following:       Result Value   Squamous Epithelial /  LPF 0-5 (*)    All other components within normal limits  COMPREHENSIVE METABOLIC PANEL - Abnormal; Notable for the following:    Total Bilirubin 2.2 (*)    All other components within normal limits  CHLAMYDIA/NGC RT PCR ([facility] ONLY)  CBC WITH DIFFERENTIAL/PLATELET  LIPASE, BLOOD    RADIOLOGY  Dg Abdomen 1 View  Result Date: 06/08/2016 CLINICAL DATA:  Right flank pain for 3 weeks, frequent urination EXAM: ABDOMEN - 1 VIEW COMPARISON:  Lumbar spine films of 02/22/2016 and abdomen films of 01/05/2016 FINDINGS: The left renal outline is overlain by bowel gas and feces making assessment difficult. However no definite renal or ureteral calculi are seen. The bowel gas pattern is nonspecific. No bony abnormality is seen. IMPRESSION: 1. No definite renal or ureteral calculi. 2. Nonspecific bowel gas pattern. Electronically Signed   By: Dwyane Dee M.D.   On: 06/08/2016 09:51   ____________________________________________   PROCEDURES Procedures    INITIAL IMPRESSION / ASSESSMENT AND PLAN / ED COURSE  Pertinent labs & imaging results that were available during my care of the patient were reviewed by me and considered in my medical decision making (see chart for details).  Very well-appearing patient. No acute distress noted. Patient sitting comfortable in on exam table on phone. Present for the complaints of 3 weeks of intermittent abdominal  and right flank pain. Minimal tenderness on exam. Denies any change in oral intake. Denies concerns of STDs. However discussed with patient as he did admit to some urinary frequency will evaluate urine for gonorrhea and chlamydia. Patient declines testing of other STDs. Will evaluate CBC, BMP, lipase and KUB. Discussed patient and plan of care with Dr Thurmond Butts who agrees with plan. Counseled with patient if unclear source today will recommend close outpatient follow-up with PCP or GI as this has been ongoing for the last 3 weeks without any increase or  change. Patient appears stable.   Labs reviewed. CBC normal. CMP bilirubin 2.2. Urinalysis only abnormal for squamous epithelial cells. KUB per radiologist no definite renal or ureteral calculi, nonspecific bowel gas pattern. Patient very well-appearing in room at this time. Patient states minimal epigastric abdominal pain at this time. Discussed with patient at this time will recommend patient to follow-up with primary care physician or gastroenterology this week. Encouraged patient to eat healthy diet, monitor for triggers, practice good bowel regimens. Discussed in detail with patient for any changes or worsening of pain to have prompt reevaluation at urgent care or ER. Counseled regarding follow up and further evaluation. Counseled patient to call today to schedule follow-up appointment. Patient agrees to this plan and states he is comfortable with plan to follow up outpatient instead of further evaluation at this time. Discussed starting on omeprazole, patient agrees, will start omeprazole, #6 percocet given for breakthrough pain. Discussed indication, risks and benefits of medications with patient. Requests work note, work note given for today.   Discussed follow up with Primary care physician this week. Discussed follow up and return parameters including no resolution or any worsening concerns. Patient verbalized understanding and agreed to plan.   ____________________________________________   FINAL CLINICAL IMPRESSION(S) / ED DIAGNOSES  Final diagnoses:  Abdominal pain, unspecified abdominal location  Flank pain     Discharge Medication List as of 06/08/2016 10:21 AM      Note: This dictation was prepared with Dragon dictation along with smaller phrase technology. Any transcriptional errors that result from this process are unintentional.    Clinical Course      Renford Dills, NP 06/08/16 1104    Renford Dills, NP 06/08/16 1104

## 2017-08-11 ENCOUNTER — Emergency Department
Admission: EM | Admit: 2017-08-11 | Discharge: 2017-08-11 | Disposition: A | Discharge status: Home / Self Care | Payer: BLUE CROSS/BLUE SHIELD | Attending: Emergency Medicine | Admitting: Emergency Medicine

## 2017-08-11 DIAGNOSIS — H9211 Otorrhea, right ear: Secondary | ICD-10-CM | POA: Insufficient documentation

## 2017-08-11 NOTE — ED Triage Notes (Signed)
Pt ambulatory to triage with no difficulty. Pt reports approx one week of URI type sx. Pt reports tonight he went to blow his nose and he felt a pop in his right ear. Now having pain to the ear. Pt talking in full and complete sentences with no difficulty at this time.

## 2017-08-11 NOTE — ED Notes (Signed)
Pt called for room x3 with no answer. 

## 2017-12-25 DIAGNOSIS — B977 Papillomavirus as the cause of diseases classified elsewhere: Secondary | ICD-10-CM | POA: Insufficient documentation

## 2017-12-25 LAB — HM HIV SCREENING LAB: HM HIV Screening: NEGATIVE

## 2017-12-26 IMAGING — CR DG ABDOMEN 2V
3 series · 3 of 3 positions shown · non-contrast
Comparison: None.

CLINICAL DATA: Chronic upper abdominal/ periumbilical region pain.
Nausea.

EXAM:
ABDOMEN - 2 VIEW

[abdomen erect]
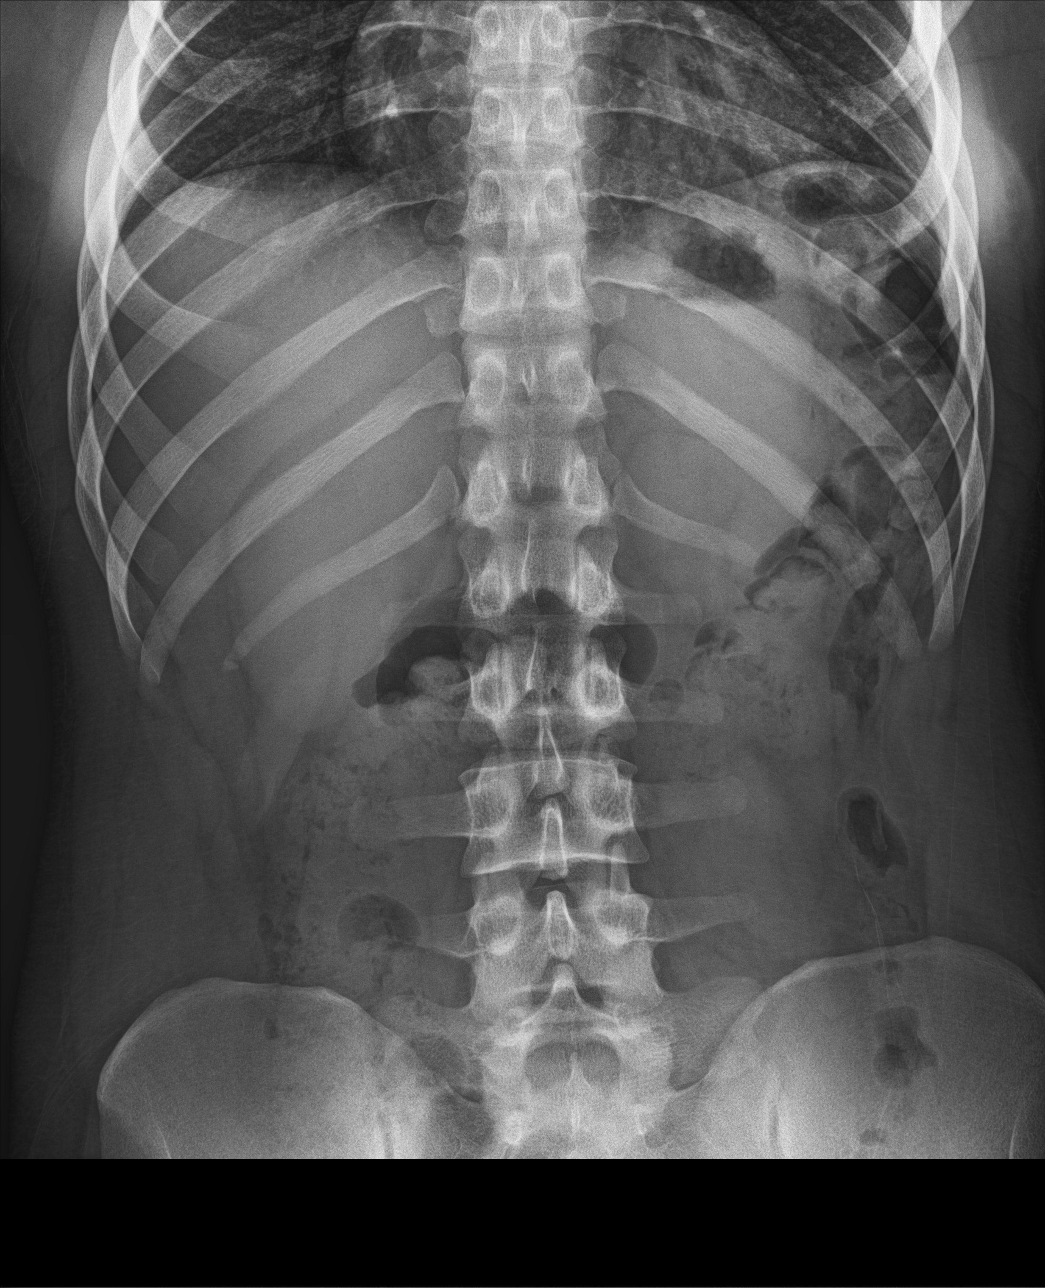

[abdomen supine (1 of 2)]
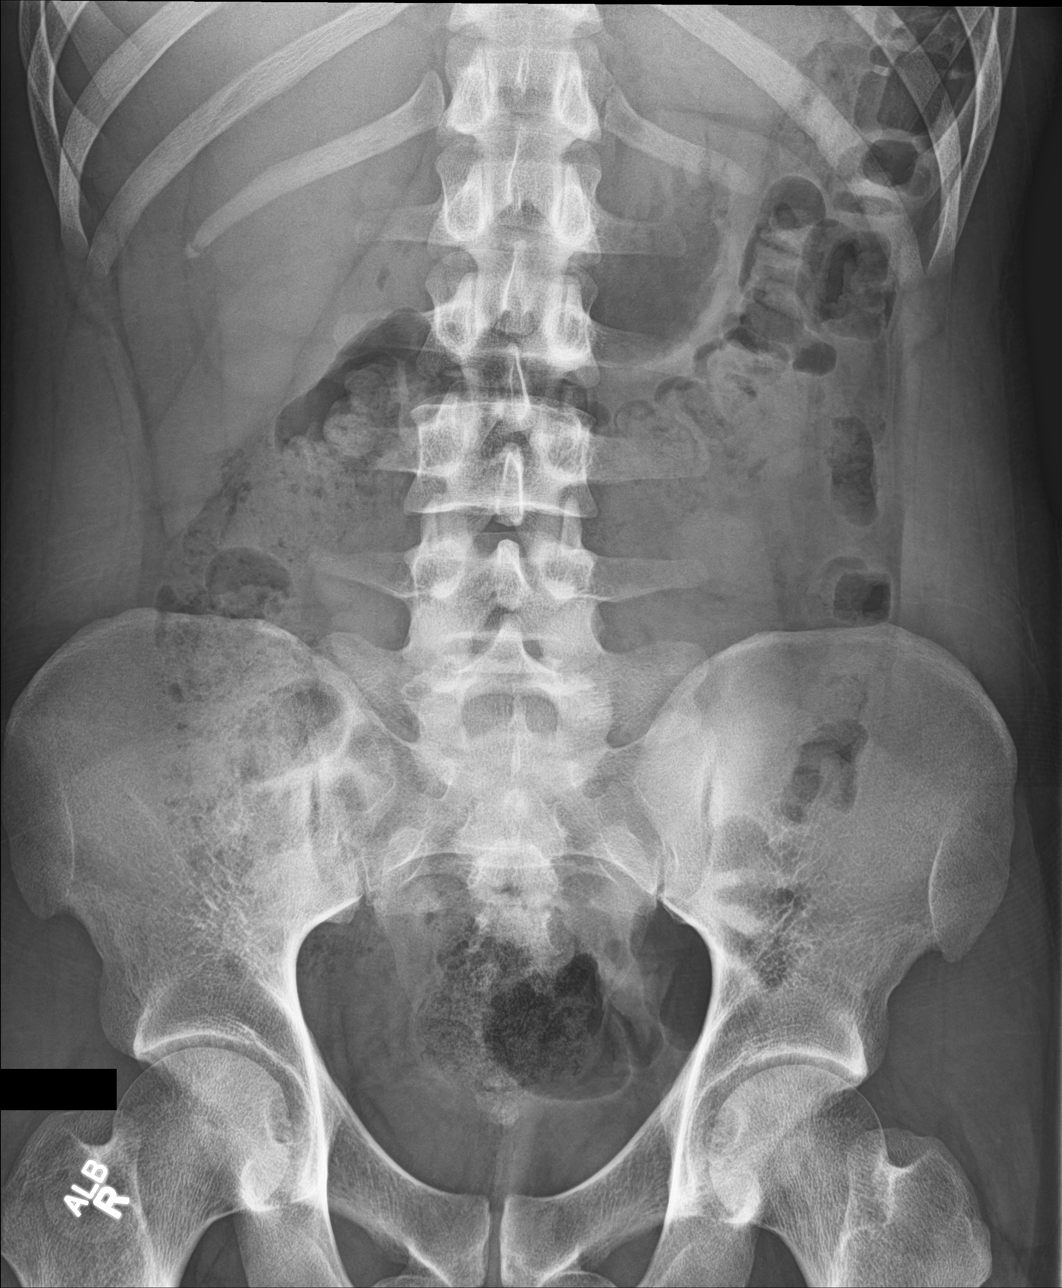

[abdomen supine (2 of 2)]
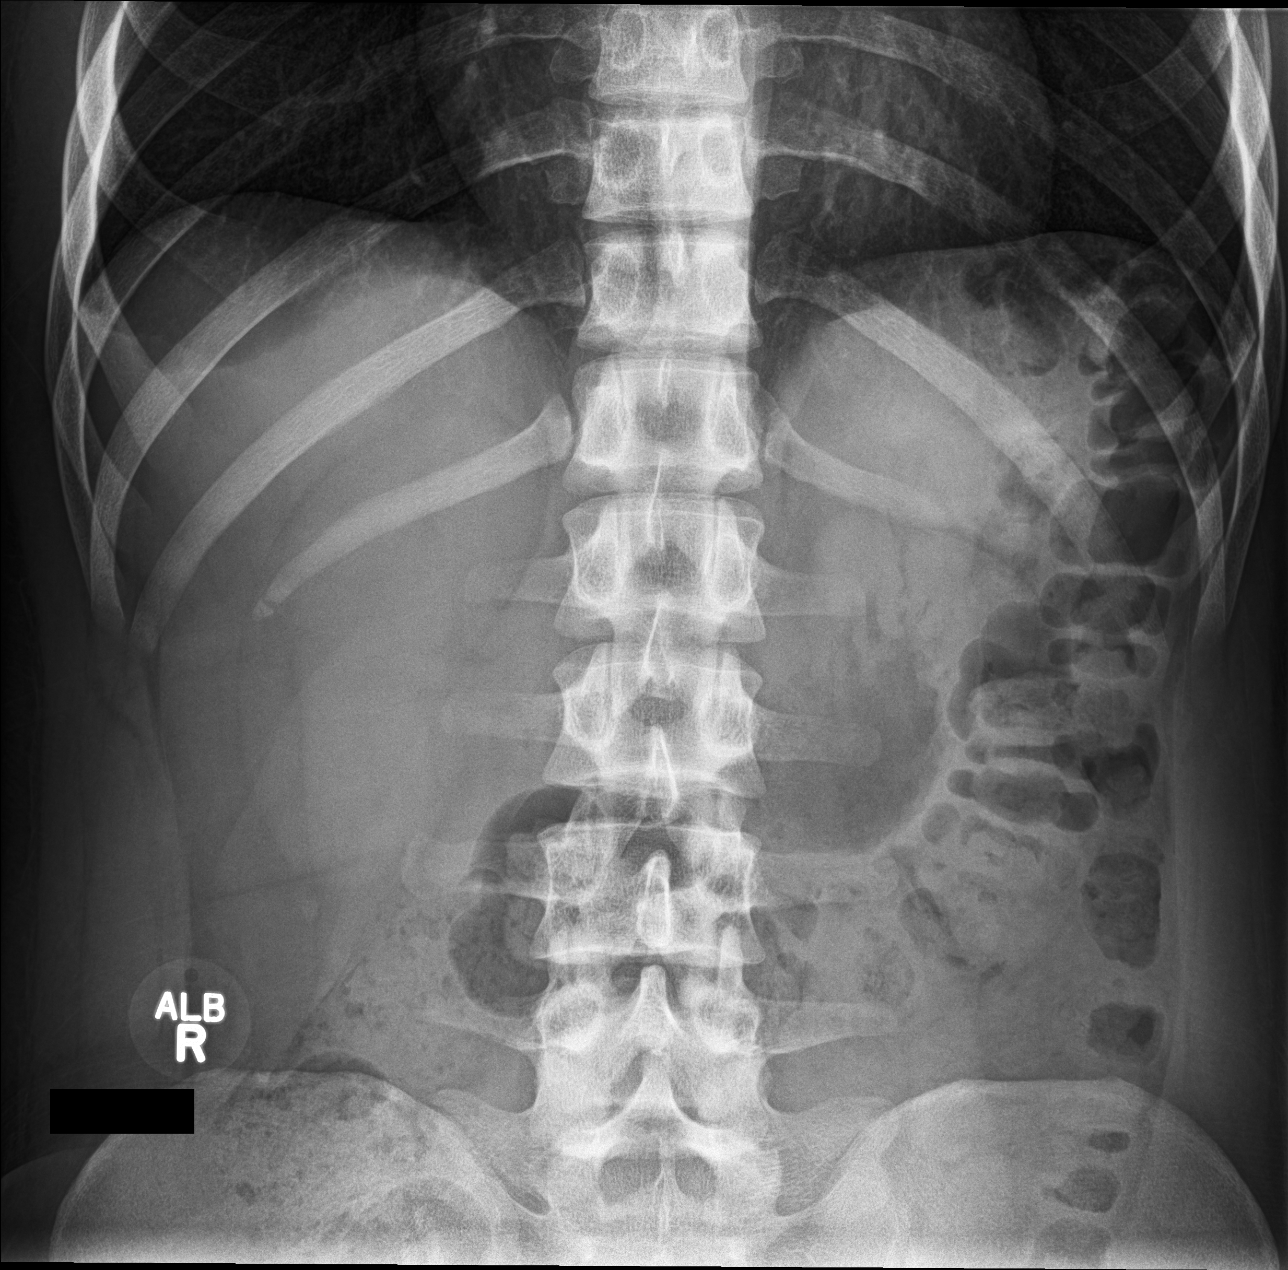

[3 of 3 positions shown; findings below may reference images not displayed]

FINDINGS: Supine and upright images were obtained. There is fairly diffuse
stool throughout the colon. There is no bowel dilatation or
air-fluid level suggesting obstruction. No free air. No abnormal
calcifications. Lung bases clear.
IMPRESSION: Diffuse stool throughout colon. No obstruction or free air evident.
Lung bases clear.

## 2019-05-27 ENCOUNTER — Ambulatory Visit: Payer: Self-pay | Admitting: *Deleted

## 2019-05-27 NOTE — Telephone Encounter (Signed)
Pt reports sore throat yesterday, "Better today after Mucinex."  States now with chest tightness and "Palpatations."  Lasting 20 seconds, occurring frequently. Denies dizziness. Reports chills alternating with sweating, checked temp during call, 98.4. Also reports SOB, and "Feel stuffy." States "Chest doesn't feel right." No known exposure, no travel. No PCP. Pt directed to UC/ED. Directed to wear mask, alert of symptoms upon entering facility. Pt verbalizes understanding.  Reason for Disposition . Difficulty breathing  Answer Assessment - Initial Assessment Questions 1. LOCATION: "Where does it hurt?"       Mid chest at sternum and to right side 2. RADIATION: "Does the pain go anywhere else?" (e.g., into neck, jaw, arms, back)     no 3. ONSET: "When did the chest pain begin?" (Minutes, hours or days)     This am 4. PATTERN "Does the pain come and go, or has it been constant since it started?"  "Does it get worse with exertion?"      Comes and goes 5. DURATION: "How long does it last" (e.g., seconds, minutes, hours)     minutes 6. SEVERITY: "How bad is the pain?"  (e.g., Scale 1-10; mild, moderate, or severe)    - MILD (1-3): doesn't interfere with normal activities     - MODERATE (4-7): interferes with normal activities or awakens from sleep    - SEVERE (8-10): excruciating pain, unable to do any normal activities       moderate 7. CARDIAC RISK FACTORS: "Do you have any history of heart problems or risk factors for heart disease?" (e.g., angina, prior heart attack; diabetes, high blood pressure, high cholesterol, smoker, or strong family history of heart disease)     no 8. PULMONARY RISK FACTORS: "Do you have any history of lung disease?"  (e.g., blood clots in lung, asthma, emphysema, birth control pills)     no 9. CAUSE: "What do you think is causing the chest pain?"     Not sure 10. OTHER SYMPTOMS: "Do you have any other symptoms?" (e.g., dizziness, nausea, vomiting, sweating, fever,  difficulty breathing, cough)       Chills, sore throat, better today after mucinex. Chest tightness, heart palpitations last 20 secs.  Protocols used: CHEST PAIN-A-AH

## 2019-07-15 ENCOUNTER — Other Ambulatory Visit: Payer: Self-pay

## 2019-07-15 ENCOUNTER — Ambulatory Visit
Admission: EM | Admit: 2019-07-15 | Discharge: 2019-07-15 | Disposition: A | Discharge status: Home / Self Care | Payer: Self-pay | Attending: Urgent Care | Admitting: Urgent Care

## 2019-07-15 ENCOUNTER — Encounter: Payer: Self-pay | Admitting: Emergency Medicine

## 2019-07-15 DIAGNOSIS — N5089 Other specified disorders of the male genital organs: Secondary | ICD-10-CM

## 2019-07-15 DIAGNOSIS — Z113 Encounter for screening for infections with a predominantly sexual mode of transmission: Secondary | ICD-10-CM

## 2019-07-15 DIAGNOSIS — Z202 Contact with and (suspected) exposure to infections with a predominantly sexual mode of transmission: Secondary | ICD-10-CM

## 2019-07-15 MED ORDER — AZITHROMYCIN 500 MG PO TABS
1000.0000 mg | ORAL_TABLET | Freq: Every day | ORAL | Status: DC
  Administered 2019-07-15: 1000 mg via ORAL

## 2019-07-15 NOTE — ED Provider Notes (Signed)
[facility] ____________________________________________  Time seen: Approximately 10:25 AM  I have reviewed the triage vital signs and the nursing notes.   HISTORY  Chief Complaint Exposure to STD   HPI Seth Murphy is a 23 y.o. male presenting for evaluation of testing and treatment for STD exposure.  Patient reports his most recent sexual partner informed him yesterday that she tested positive for chlamydia.  States he has not had any other sexual partner since her and it has been a few weeks since intercourse.  Denies any penile discharge, penile or testicular pain, rash or swelling.  No dysuria.  Continues eat and drink well.  No accompanying back or abdominal pain.  No fevers.  Declines any other concerns of STDs.  Patient also reports he noticed 2 small knots to his left testicle approximately 2 months ago.  States occasionally his testicle feels somewhat heavy but not painful.  States he noticed this 2 months ago and is not always present.  Denies any other changes.  Reports otherwise doing well.  No injury.  Denies pain at this time.  Patient, No Pcp Per    Past Medical History:  Diagnosis Date  . Herpes     There are no active problems to display for this patient.   History reviewed. No pertinent surgical history.    Current Facility-Administered Medications:  .  azithromycin (ZITHROMAX) tablet 1,000 mg, 1,000 mg, Oral, Daily, Renford DillsMiller, Turhan Chill, NP  Current Outpatient Medications:  .  LORazepam (ATIVAN) 1 MG tablet, 1 tab po qd prn severe anxiety, Disp: 4 tablet, Rfl: 0 .  meloxicam (MOBIC) 15 MG tablet, Take 1 tablet (15 mg total) by mouth daily., Disp: 30 tablet, Rfl: 1 .  omeprazole (PRILOSEC) 20 MG capsule, Take 1 capsule (20 mg total) by mouth daily., Disp: 10 capsule, Rfl: 0 .  oxyCODONE-acetaminophen (ROXICET) 5-325 MG tablet, Take 0.5 tablets by mouth every 8 (eight) hours as needed for moderate pain or severe pain (0.5-1 tablet as needed for  pain. Do not drive or operate heavy machinery while taking as can cause drowsiness.)., Disp: 6 tablet, Rfl: 0 .  ranitidine (ZANTAC) 150 MG tablet, Take 1 tablet (150 mg total) by mouth at bedtime., Disp: 30 tablet, Rfl: 1 .  sertraline (ZOLOFT) 50 MG tablet, Take 1 tablet (50 mg total) by mouth daily., Disp: 21 tablet, Rfl: 0 .  sucralfate (CARAFATE) 1 g tablet, Take 1 tablet (1 g total) by mouth 4 (four) times daily., Disp: 60 tablet, Rfl: 0 .  valACYclovir (VALTREX) 1000 MG tablet, Take 1 tablet (1,000 mg total) by mouth 2 (two) times daily., Disp: 14 tablet, Rfl: 0  Allergies Amoxicillin  Family History  Problem Relation Age of Onset  . Healthy Mother   . Healthy Father     Social History Social History   Tobacco Use  . Smoking status: Current Every Day Smoker    Packs/day: 0.50    Types: Cigarettes  . Smokeless tobacco: Never Used  Substance Use Topics  . Alcohol use: Yes    Comment: occassional  . Drug use: Yes    Frequency: 3.0 times per week    Types: Marijuana    Review of Systems Constitutional: No fever ENT: No sore throat. Cardiovascular: Denies chest pain. Respiratory: Denies shortness of breath. Gastrointestinal: No abdominal pain.  No nausea, no vomiting.  No diarrhea. Genitourinary: Negative for dysuria. As above.  Musculoskeletal: Negative for back pain. Skin: Negative for rash.  ____________________________________________   PHYSICAL EXAM:  VITAL SIGNS: ED Triage Vitals  Enc Vitals Group     BP 07/15/19 0955 119/69     Pulse Rate 07/15/19 0955 (!) 55     Resp 07/15/19 0955 18     Temp 07/15/19 0955 98.3 F (36.8 C)     Temp Source 07/15/19 0955 Oral     SpO2 07/15/19 0955 100 %     Weight 07/15/19 0957 185 lb (83.9 kg)     Height 07/15/19 0957 5\' 10"  (1.778 m)     Head Circumference --      Peak Flow --      Pain Score 07/15/19 0957 0     Pain Loc --      Pain Edu? --      Excl. in GC? --     Constitutional: Alert and oriented. Well  appearing and in no acute distress. Eyes: Conjunctivae are normal. ENT      Head: Normocephalic and atraumatic. Cardiovascular: Normal rate, regular rhythm. Grossly normal heart sounds.  Good peripheral circulation. Respiratory: Normal respiratory effort without tachypnea nor retractions. Breath sounds are clear and equal bilaterally. No wheezes, rales, rhonchi. Gastrointestinal: Soft and nontender. No CVA tenderness. Male: Exam completed with Gunnar Fusi CMA at bedside as chaperone.  No penile discharge, rash or swelling.  Circumcised.  No penile or testicular tenderness.  No palpable nodules or mass to bilateral testicles.  No inguinal lymphadenopathy palpated. Musculoskeletal:  Steady gait. Neurologic:  Normal speech and language. Speech is normal. No gait instability.  Skin:  Skin is warm, dry and intact. No rash noted. Psychiatric: Mood and affect are normal. Speech and behavior are normal. Patient exhibits appropriate insight and judgment   ___________________________________________   LABS (all labs ordered are listed, but only abnormal results are displayed)  Labs Reviewed  GC/CHLAMYDIA PROBE AMP    PROCEDURES Procedures    INITIAL IMPRESSION / ASSESSMENT AND PLAN / ED COURSE  Pertinent labs & imaging results that were available during my care of the patient were reviewed by me and considered in my medical decision making (see chart for details).  Well-appearing patient.  No acute distress.  Concern of STD exposure to chlamydia.  Gonorrhea and Chlamydia testing completed, patient declined further STD testing.  Will empirically treat with oral azithromycin.  Patient also reported concern of palpable nodule to the left testicle, no nodules or masses palpated on exam.  However discussed with patient to proceed with ultrasound, patient agreed.  However nursing staff discussed with patient, patient's insurance will not cover for ultrasound and discuss continuing ultrasound.  Patient at  that point states he does not want to have ultrasound completed.  Strongly encouraged patient to follow-up to have the ultrasound completed, and information for [facility] department given.  Patient at this time understands and declines ultrasound.  1 g oral azithromycin given in urgent care.Discussed indication, risks and benefits of medications with patient.   Discussed follow up and return parameters including no resolution or any worsening concerns. Patient verbalized understanding and agreed to plan.   ____________________________________________   FINAL CLINICAL IMPRESSION(S) / ED DIAGNOSES  Final diagnoses:  STD exposure  Screen for STD (sexually transmitted disease)  Testicle lump     ED Discharge Orders         Ordered    US SCROTUM W/DOPPLER     07/15/19 1019           Note: This dictation was prepared with Dragon dictation along with smaller phrase technology. Any  transcriptional errors that result from this process are unintentional.         Marylene Land, NP 07/15/19 [phone]

## 2019-07-15 NOTE — Discharge Instructions (Signed)
Safe sex. No sexual activity this week.  Please follow-up as discussed to have further examination and imaging.  Follow up with your primary care physician this week as needed. Return to Urgent care for new or worsening concerns.

## 2019-07-15 NOTE — ED Triage Notes (Addendum)
Patient in today stating that a girl he was seeing tested positive for Chlamydia and wants to be tested. Patient denies penile discharge or dysuria.  Patient also states that he has a knot in his testicle sac x 2 months.

## 2019-07-16 ENCOUNTER — Ambulatory Visit: Payer: Self-pay

## 2019-07-18 LAB — GC/CHLAMYDIA PROBE AMP
Chlamydia trachomatis, NAA: NEGATIVE
Neisseria Gonorrhoeae by PCR: NEGATIVE

## 2019-11-22 ENCOUNTER — Emergency Department
Admission: EM | Admit: 2019-11-22 | Discharge: 2019-11-22 | Disposition: A | Discharge status: Home / Self Care | Payer: Medicaid Other | Attending: Emergency Medicine | Admitting: Emergency Medicine

## 2019-11-22 ENCOUNTER — Other Ambulatory Visit: Payer: Self-pay

## 2019-11-22 DIAGNOSIS — K0889 Other specified disorders of teeth and supporting structures: Secondary | ICD-10-CM | POA: Insufficient documentation

## 2019-11-22 DIAGNOSIS — Z5321 Procedure and treatment not carried out due to patient leaving prior to being seen by health care provider: Secondary | ICD-10-CM | POA: Insufficient documentation

## 2019-11-22 NOTE — ED Triage Notes (Signed)
Patient reports having a hole in left upper tooth and it causing pain for approximately 1 month.  States pain is worse.

## 2020-01-15 DIAGNOSIS — B977 Papillomavirus as the cause of diseases classified elsewhere: Secondary | ICD-10-CM

## 2020-01-16 ENCOUNTER — Ambulatory Visit: Payer: Self-pay | Admitting: Family Medicine

## 2020-01-16 ENCOUNTER — Other Ambulatory Visit: Payer: Self-pay

## 2020-01-16 ENCOUNTER — Telehealth: Payer: Self-pay | Admitting: Family Medicine

## 2020-01-16 DIAGNOSIS — I861 Scrotal varices: Secondary | ICD-10-CM

## 2020-01-16 DIAGNOSIS — Z113 Encounter for screening for infections with a predominantly sexual mode of transmission: Secondary | ICD-10-CM

## 2020-01-16 LAB — GRAM STAIN

## 2020-01-16 NOTE — Progress Notes (Signed)
Pt here for STD screening.Orry Sigl, RN 

## 2020-01-16 NOTE — Progress Notes (Signed)
Gram stain reviewed, no treatment indicated. PCP list and AVS given.Burt Knack, RN

## 2020-01-16 NOTE — Progress Notes (Signed)
[facility] Department STI clinic/screening visit  Subjective:  Seth Murphy is a 24 y.o. male being seen today for  Chief Complaint  Patient presents with  . SEXUALLY TRANSMITTED DISEASE    STD screening     The patient reports they do not have symptoms.   Patient has the following medical conditions:   Patient Active Problem List   Diagnosis Date Noted  . 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   See flowsheet for further details and programmatic requirements.   Tdap: 2016   No components found for: HCV  The following portions of the patient's history were reviewed and updated as appropriate: allergies, current medications, past medical history, past social history, past surgical history and problem list.  Objective:  There were no vitals filed for this visit.   Physical Exam Constitutional:      Appearance: Normal appearance.  HENT:     Head: Normocephalic and atraumatic.     Comments: No nits or hair loss    Mouth/Throat:     Mouth: Mucous membranes are moist.     Pharynx: Oropharynx is clear. No oropharyngeal exudate or posterior oropharyngeal erythema.  Pulmonary:     Effort: Pulmonary effort is normal.  Abdominal:     General: Abdomen is flat.     Palpations: Abdomen is soft. There is no hepatomegaly or mass.     Tenderness: There is no abdominal tenderness.  Genitourinary:    Pubic Area: No rash or pubic lice.      Penis: Normal and circumcised.      Testes:        Right: Mass, tenderness, swelling, testicular hydrocele or varicocele not present. Right testis is descended. Cremasteric reflex is present.         Left: Tenderness (mild) and varicocele present. Mass, swelling or testicular hydrocele not present. Left testis is descended. Cremasteric reflex is present.      Epididymis:     Right: Normal.     Left: Normal.     Rectum: Normal.  Lymphadenopathy:     Head:     Right side of head: No preauricular or posterior auricular adenopathy.     Left side of head: No preauricular or posterior auricular adenopathy.     Cervical: No cervical adenopathy.     Upper Body:     Right upper body: No supraclavicular or axillary adenopathy.     Left upper body: No supraclavicular or axillary adenopathy.     Lower Body: No right inguinal adenopathy. No left inguinal adenopathy.  Skin:    General: Skin is warm and dry.     Findings: No rash.  Neurological:     Mental Status: He is alert and oriented to person, place, and time.       Assessment and Plan:  Seth Murphy is a 24 y.o. male presenting to the [facility] Department for STI screening   1. Screening examination for venereal disease -Screenings today as below.  -Patient does meet criteria for HepB, HepC Screening. Declines these screenings. -Counseled on warning s/sx and when to seek care. Recommended condom use with all sex and discussed importance of condom use for STI prevention. - Gram stain - HIV [facility] - Syphilis Serology, [facility] - Gonococcus culture  2. Left varicocele -Varicocele on exam, pt states it has been there x several years,  somewhat tender/achy. Discussed nature of condition, handout given. PCP list given and advised to follow up for further evaluation as this is bothersome to pt.     Return for screening as needed.  No future appointments.  Kandee Keen, PA-C

## 2020-01-16 NOTE — Patient Instructions (Signed)
Varicocele  A varicocele is a swelling of veins in the scrotum. The scrotum is the sac that contains the testicles. Varicoceles can occur on either side of the scrotum, but they are more common on the left side. They occur most often in teenage boys and young men. In most cases, varicoceles are not a serious problem. They are usually small and painless and do not require treatment. Tests may be done to confirm the diagnosis. Treatment may be needed if:  A varicocele is large, causes a lot of pain, or causes pain when exercising.  Varicoceles are found on both sides of the scrotum.  A varicocele causes a decrease in the size of the testicle in a growing adolescent.  The person has fertility problems. What are the causes? This condition is the result of valves in the veins not working properly. Valves in the veins help to return blood from the scrotum and testicles to the heart. If these valves do not work well, blood flows backward and backs up into the veins, which causes the veins to swell. This is similar to what happens when varicose veins form in the leg. What are the signs or symptoms? Most varicoceles do not cause any symptoms. If symptoms do occur, they may include:  Swelling on one side of the scrotum. The swelling may be more obvious when you are standing up.  A lumpy feeling in the scrotum.  A heavy feeling on one side of the scrotum.  A dull ache in the scrotum, especially after exercise or prolonged standing or sitting.  Slower growth or reduced size of the testicle on the side of the varicocele (in young males).  Problems with fathering a child (fertility). This can occur if the testicle does not grow normally or if the condition causes problems with the sperm, such as a low sperm count or sperm that are not able to reach the egg (poor motility). How is this diagnosed? This condition is diagnosed based on:  Your medical history.  A physical exam. Your health care  provider may inspect and feel (palpate) the scrotal area to check for swollen or enlarged veins.  An ultrasound. This may be done to confirm the diagnosis and to help rule out other causes of the swelling. How is this treated? Treatment is usually not needed for this condition. If you have any pain, your health care provider may prescribe or recommend medicine to help relieve it. You may need regular exams so your health care provider can monitor the varicocele to ensure that it does not cause problems. When further treatment is needed, it may involve one of these options:  Varicocelectomy. This is a surgery in which the swollen veins are tied off so that the flow of blood goes to other veins instead.  Embolization. In this procedure, a small, thin tube (catheter) is used to place metal coils or other blocking items in the veins. This cuts off the blood flow to the swollen veins. Follow these instructions at home:  Take over-the-counter and prescription medicines only as told by your health care provider.  Wear supportive underwear.  Use an athletic supporter when participating in sports activities.  Keep all follow-up visits as told by your health care provider. This is important. Contact a health care provider if:  Your pain is increasing.  You have redness in the affected area.  Your testicle becomes enlarged, swollen, or painful.  You have swelling that does not decrease when you are lying down.    One of your testicles is smaller than the other. Get help right away if:  You develop swelling in your legs.  You have difficulty breathing. Summary  Varicocele is a condition in which the veins in the scrotum are swollen or enlarged.  In most cases, varicoceles do not require treatment.  Treatment may be needed if you have pain, have problems with infertility, or have a smaller testicle associated with the varicocele.  In some cases, the condition may be treated with a  procedure to cut off the flow of blood to the swollen veins. This information is not intended to replace advice given to you by your health care provider. Make sure you discuss any questions you have with your health care provider. Document Revised: 01/10/2019 Document Reviewed: 01/10/2019 Elsevier Patient Education  2020 [employer]vier Inc.  

## 2020-01-20 LAB — GONOCOCCUS CULTURE

## 2020-02-26 NOTE — Telephone Encounter (Signed)
Pt has questions about results  

## 2020-02-27 NOTE — Telephone Encounter (Signed)
Erroneous encounter.Teale Goodgame, RN  

## 2021-10-06 ENCOUNTER — Ambulatory Visit: Payer: Medicaid Other

## 2023-09-09 ENCOUNTER — Emergency Department (HOSPITAL_COMMUNITY)
Admission: EM | Admit: 2023-09-09 | Discharge: 2023-09-09 | Disposition: A | Discharge status: Home / Self Care | Payer: Self-pay | Attending: Emergency Medicine | Admitting: Emergency Medicine

## 2023-09-09 ENCOUNTER — Encounter (HOSPITAL_COMMUNITY): Payer: Self-pay

## 2023-09-09 ENCOUNTER — Other Ambulatory Visit: Payer: Self-pay

## 2023-09-09 ENCOUNTER — Emergency Department (HOSPITAL_COMMUNITY): Payer: Self-pay

## 2023-09-09 DIAGNOSIS — M79632 Pain in left forearm: Secondary | ICD-10-CM | POA: Insufficient documentation

## 2023-09-09 DIAGNOSIS — S70212A Abrasion, left hip, initial encounter: Secondary | ICD-10-CM | POA: Insufficient documentation

## 2023-09-09 DIAGNOSIS — S60511A Abrasion of right hand, initial encounter: Secondary | ICD-10-CM | POA: Insufficient documentation

## 2023-09-09 DIAGNOSIS — T148XXA Other injury of unspecified body region, initial encounter: Secondary | ICD-10-CM

## 2023-09-09 DIAGNOSIS — S30810A Abrasion of lower back and pelvis, initial encounter: Secondary | ICD-10-CM | POA: Insufficient documentation

## 2023-09-09 MED ORDER — METHOCARBAMOL 500 MG PO TABS
500.0000 mg | ORAL_TABLET | Freq: Two times a day (BID) | ORAL | 0 refills | Status: DC
Start: 2023-09-09 — End: 2023-09-10

## 2023-09-09 MED ORDER — NAPROXEN 500 MG PO TABS
500.0000 mg | ORAL_TABLET | Freq: Two times a day (BID) | ORAL | 0 refills | Status: DC
Start: 2023-09-09 — End: 2023-09-10

## 2023-09-09 NOTE — ED Provider Triage Note (Signed)
Emergency Medicine Provider Triage Evaluation Note  Seth Murphy , a 27 y.o. male  was evaluated in triage.  Pt complains of fall off horse yesterday. Denies hitting head. Pain to lower back, left forearm, right hand. Tetanus up to date  Review of Systems  Positive: fall Negative:   Physical Exam  BP (!) 137/90   Pulse 96   Resp 17   Ht 5\' 10"  (1.778 m)   Wt 90.7 kg   SpO2 100%   BMI 28.70 kg/m  Gen:   Awake, no distress   Resp:  Normal effort  MSK:   Moves extremities without difficulty, skin tears, large abrasion to lower posterior back, left posterior hip Other:    Medical Decision Making  Medically screening exam initiated at 3:40 PM.  Appropriate orders placed.  WARWICK NICK was informed that the remainder of the evaluation will be completed by another provider, this initial triage assessment does not replace that evaluation, and the importance of remaining in the ED until their evaluation is complete.  Fall off horse   Linwood Dibbles, PA-C 09/09/23 1542

## 2023-09-09 NOTE — Discharge Instructions (Addendum)
It was a pleasure taking care of you here today  I would place bacitracin ointment over your abrasions  Tylenol, naproxen and Robaxin for pain  Return for new or worsening symptoms

## 2023-09-09 NOTE — ED Provider Notes (Signed)
[place] EMERGENCY DEPARTMENT AT [facility] Provider Note   CSN: [id number] Arrival date & time: 09/09/23  1440    History  Chief Complaint  Patient presents with   Back Pain   Hand Injury    Seth Murphy is a 27 y.o. male for evaluation of lower back pain/sacral pain, left forearm pain and right hand pain.  He states he was on a horse yesterday, subsequently fell off.  He denies hitting his head, LOC, anticoagulation.  He has been ambulatory since.  He denies any neck pain, numbness, weakness, bowel or bladder incontinence, saddle paresthesia.  He denies any chest pain, shortness of breath or abdominal pain.  He noted an abrasion to his lower back and right hand  HPI     Home Medications Prior to Admission medications   Medication Sig Start Date End Date Taking? Authorizing Provider  methocarbamol (ROBAXIN) 500 MG tablet Take 1 tablet (500 mg total) by mouth 2 (two) times daily. 09/09/23  Yes Aitana Burry A, PA-C  naproxen (NAPROSYN) 500 MG tablet Take 1 tablet (500 mg total) by mouth 2 (two) times daily. 09/09/23  Yes Chelsey Kimberley A, PA-C      Allergies    Amoxicillin    Review of Systems   Review of Systems  Constitutional: Negative.   HENT: Negative.    Respiratory: Negative.    Cardiovascular: Negative.   Gastrointestinal: Negative.   Genitourinary: Negative.   Musculoskeletal:  Positive for back pain. Negative for arthralgias, gait problem, joint swelling, myalgias, neck pain and neck stiffness.  Skin:  Positive for wound.  Neurological: Negative.   All other systems reviewed and are negative.   Physical Exam Updated Vital Signs BP (!) 141/81 (BP Location: Right Arm)   Pulse 70   Temp 98.2 F (36.8 C) (Oral)   Resp 18   Ht 5\' 10"  (1.778 m)   Wt 90.7 kg   SpO2 99%   BMI 28.70 kg/m  Physical Exam Vitals and nursing note reviewed.  Constitutional:      General: He is not in acute distress.    Appearance: He is well-developed. He is  not ill-appearing, toxic-appearing or diaphoretic.  HENT:     Head: Normocephalic and atraumatic.     Mouth/Throat:     Mouth: Mucous membranes are moist.  Eyes:     Pupils: Pupils are equal, round, and reactive to light.  Cardiovascular:     Rate and Rhythm: Normal rate and regular rhythm.     Pulses: Normal pulses.          Radial pulses are 2+ on the right side and 2+ on the left side.     Heart sounds: Normal heart sounds.  Pulmonary:     Effort: Pulmonary effort is normal. No respiratory distress.     Breath sounds: Normal breath sounds.     Comments: Clear bilaterally, speaks in full sentences without difficulty Abdominal:     General: Bowel sounds are normal. There is no distension.     Palpations: Abdomen is soft.     Tenderness: There is no abdominal tenderness. There is no right CVA tenderness, left CVA tenderness, guarding or rebound.     Comments: Soft, nontender, no rebound or guarding.  No obvious bruising to abdominal wall or flank  Musculoskeletal:        General: Normal range of motion.     Cervical back: Normal range of motion and neck supple.     Comments: No  midline C/T tenderness.  Mild tenderness to left lower lumbar region into sacrum.  Pelvis stable, nontender to palpation.  He has mild superficial abrasion to left posterior lower hip as well as over his lower back abrasion.  Abrasion to right hand, no nailbed injury  Skin:    General: Skin is warm and dry.  Neurological:     General: No focal deficit present.     Mental Status: He is alert and oriented to person, place, and time.     ED Results / Procedures / Treatments   Labs (all labs ordered are listed, but only abnormal results are displayed) Labs Reviewed - No data to display  EKG None  Radiology DG Sacrum/Coccyx  Result Date: 09/09/2023 CLINICAL DATA:  Fall off horse.  Sacrococcygeal pain. EXAM: SACRUM AND COCCYX - 2+ VIEW COMPARISON:  None Available. FINDINGS: There is no evidence of  fracture or other focal bone lesions. IMPRESSION: Negative. Electronically Signed   By: Danae Orleans M.D.   On: 09/09/2023 16:51   DG Lumbar Spine Complete  Result Date: 09/09/2023 CLINICAL DATA:  Fall off horse.  Low back injury and pain. EXAM: LUMBAR SPINE - COMPLETE 4+ VIEW COMPARISON:  02/22/2016 FINDINGS: There is no evidence of lumbar spine fracture. Alignment is normal. Intervertebral disc spaces are maintained. No evidence of facet arthropathy or other osseous abnormality. IMPRESSION: Negative. Electronically Signed   By: Danae Orleans M.D.   On: 09/09/2023 16:50   DG Pelvis 1-2 Views  Result Date: 09/09/2023 CLINICAL DATA:  Fall off horse.  Pelvic pain. EXAM: PELVIS - 1-2 VIEW COMPARISON:  None Available. FINDINGS: There is no evidence of pelvic fracture or diastasis. No pelvic bone lesions are seen. IMPRESSION: Negative. Electronically Signed   By: Danae Orleans M.D.   On: 09/09/2023 16:49   DG Forearm Left  Result Date: 09/09/2023 CLINICAL DATA:  Fall off horse.  Left forearm injury and pain. EXAM: LEFT FOREARM - 2 VIEW COMPARISON:  None Available. FINDINGS: There is no evidence of fracture or other focal bone lesions. Soft tissues are unremarkable. IMPRESSION: Negative. Electronically Signed   By: Danae Orleans M.D.   On: 09/09/2023 16:49   DG Hand Complete Right  Result Date: 09/09/2023 CLINICAL DATA:  Fall off horse.  Right hand injury and pain. EXAM: RIGHT HAND - COMPLETE 3+ VIEW COMPARISON:  None Available. FINDINGS: There is no evidence of fracture or dislocation. There is no evidence of arthropathy or other focal bone abnormality. Soft tissues are unremarkable. IMPRESSION: Negative. Electronically Signed   By: Danae Orleans M.D.   On: 09/09/2023 16:48    Procedures Procedures    Medications Ordered in ED Medications - No data to display  ED Course/ Medical Decision Making/ A&P   27 year old for evaluation of fall off horse which occurred yesterday, greater than 24 hours PTA.   Denies hitting head, LOC, anticoagulation.  He has some pain to his lower back, upper sacral region as well as left forearm and right hand.  No headache, midline back pain, chest pain, abdominal pain, flank pain.  His tetanus is up-to-date.  He has scabbed over abrasion to posterior lower back  Imaging personally viewed and interpreted:  No significant abnormality  Discussed results with patient.  He will follow-up outpatient, return for new or worsening symptoms.  At this time I low suspicion for acute intracranial, neurosurgical, intrathoracic, intra-abdominal etiology  The patient has been appropriately medically screened and/or stabilized in the ED. I have low suspicion for any  other emergent medical condition which would require further screening, evaluation or treatment in the ED or require inpatient management.  Patient is hemodynamically stable and in no acute distress.  Patient able to ambulate in department prior to ED.  Evaluation does not show acute pathology that would require ongoing or additional emergent interventions while in the emergency department or further inpatient treatment.  I have discussed the diagnosis with the patient and answered all questions.  Pain is been managed while in the emergency department and patient has no further complaints prior to discharge.  Patient is comfortable with plan discussed in room and is stable for discharge at this time.  I have discussed strict return precautions for returning to the emergency department.  Patient was encouraged to follow-up with PCP/specialist refer to at discharge.                                 Medical Decision Making Amount and/or Complexity of Data Reviewed External Data Reviewed: labs, radiology and notes. Radiology: ordered and independent interpretation performed. Decision-making details documented in ED Course.  Risk OTC drugs. Prescription drug management. Decision regarding hospitalization. Diagnosis or  treatment significantly limited by social determinants of health.          Final Clinical Impression(s) / ED Diagnoses Final diagnoses:  Fall from horse, initial encounter  Abrasion    Rx / DC Orders ED Discharge Orders          Ordered    naproxen (NAPROSYN) 500 MG tablet  2 times daily        09/09/23 1757    methocarbamol (ROBAXIN) 500 MG tablet  2 times daily        09/09/23 1757              Zimere Dunlevy A, PA-C 09/09/23 2330    Elayne Snare K, DO 09/10/23 0011

## 2023-09-09 NOTE — ED Triage Notes (Signed)
Pt complains of fall off horse yesterday. No LOC. Denies hitting head. Pain to lower back and right hand.

## 2023-09-10 ENCOUNTER — Telehealth (HOSPITAL_COMMUNITY): Payer: Self-pay | Admitting: Emergency Medicine

## 2023-09-10 MED ORDER — NAPROXEN 500 MG PO TABS: 500.0000 mg | ORAL_TABLET | Freq: Two times a day (BID) | ORAL | 0 refills | Status: AC

## 2023-09-10 MED ORDER — METHOCARBAMOL 500 MG PO TABS: 500.0000 mg | ORAL_TABLET | Freq: Two times a day (BID) | ORAL | 0 refills | Status: AC

## 2023-09-10 NOTE — ED Notes (Signed)
09/10/23 1008 pt called requesting prescriptions be resent to [facility] [address], [place]. Updated in chart, Dr Rodena Medin resending.

## 2023-09-11 NOTE — Telephone Encounter (Signed)
Patient requested adjustment of pharmacy that scripts sent to.

## 2024-04-18 ENCOUNTER — Encounter: Payer: Self-pay | Admitting: Nurse Practitioner

## 2024-04-18 ENCOUNTER — Ambulatory Visit: Payer: Self-pay | Admitting: Nurse Practitioner

## 2024-04-18 DIAGNOSIS — Z113 Encounter for screening for infections with a predominantly sexual mode of transmission: Secondary | ICD-10-CM

## 2024-04-18 LAB — HM HIV SCREENING LAB: HM HIV Screening: NEGATIVE

## 2024-04-18 NOTE — Progress Notes (Signed)
 [facility] Department STI clinic [address], [address] [place] [address] Main phone: [phone]  STI screening visit  Subjective:  VANDELL KUN is a 28 y.o. male being seen today for an STI screening visit. The patient reports they do have symptoms.    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 No concerns. Denies penile discharge, lesions, rashes, lymphadenopathy.   See flowsheet for further details and programmatic requirements  Hyperlink available at the top of the signed note in blue.  Flow sheet content below:  Pregnancy Intention Screening Does the patient want to become pregnant in the next year?: No Does the patient's partner want to become pregnant in the next year?: No Would the patient like to discuss contraceptive options today?: N/A Counseling Patient counseled to use condoms with all sex: Condoms declined RTC in 2-3 weeks for test results: Yes Clinic will call if test results abnormal before test result appt.: Yes Test results given to patient Patient counseled to use condoms with all sex: Condoms declined  Screening for MPX risk: Does the patient have an unexplained rash? No Is the patient MSM? No Does the patient endorse multiple sex partners or anonymous sex partners? Yes Did the patient have close or sexual contact with a person diagnosed with MPX? No Has the patient traveled outside the [place]  where MPX is endemic? No Is there a high clinical suspicion for MPX-- evidenced by one of the following No  -Unlikely to be chickenpox  -Lymphadenopathy  -Rash that present in same phase of evolution on any given body part  STI screening history: Last HIV test per patient/review of  record was  Lab Results  Component Value Date   HMHIVSCREEN Negative - Validated 12/25/2017   No results found for: HIV  Last HEPC test per patient/review of record was No results found for: HMHEPCSCREEN No components found for: HEPC   Last HEPB test per patient/review of record was No components found for: HMHEPBSCREEN   Fertility: Does the patient or their partner desires a pregnancy in the next year? No  Immunization History  Administered Date(s) Administered   DTP 07/17/1994, 01/01/1995, 02/26/1995, 08/29/1995   DTaP 07/14/1999   Hepatitis B, ADULT 06/06/1994, 07/17/1994, 02/26/1995   MMR 08/29/1995, 07/14/1999   OPV 07/17/1994, 01/01/1995, 08/29/1995, 07/14/1999   Tdap 01/21/2015    The following portions of the patient's history were reviewed and updated as appropriate: allergies, current medications, past medical history, past social history, past surgical history and problem list.  Objective:  There were no vitals filed for this visit.  Physical Exam Vitals and nursing note reviewed.  Constitutional:      General: He is not in acute distress.    Appearance: Normal appearance. He is normal weight. He is not toxic-appearing.  HENT:     Head: Normocephalic and atraumatic.     Mouth/Throat:     Mouth: Mucous membranes are moist.     Pharynx: No oropharyngeal exudate or posterior oropharyngeal erythema.   Eyes:     General: No scleral icterus.       Right eye: No discharge.        Left eye: No discharge.     Conjunctiva/sclera: Conjunctivae normal.  Right eye: Right conjunctiva is not injected. No exudate.    Left eye: Left conjunctiva is not injected. No exudate.  Pulmonary:     Effort: Pulmonary effort is normal.  Abdominal:     General: Abdomen is flat.     Palpations: Abdomen is soft. There is no hepatomegaly or mass.     Tenderness: There is no abdominal tenderness. There is no rebound.  Genitourinary:    Comments: Declined genital exam-  asymptomatic  Musculoskeletal:     Cervical back: Neck supple. No rigidity or tenderness.  Lymphadenopathy:     Head:     Right side of head: No submandibular, preauricular or posterior auricular adenopathy.     Left side of head: No submandibular, preauricular or posterior auricular adenopathy.     Cervical: No cervical adenopathy.     Right cervical: No superficial or posterior cervical adenopathy.    Left cervical: No superficial or posterior cervical adenopathy.     Upper Body:     Right upper body: No supraclavicular adenopathy.     Left upper body: No supraclavicular adenopathy.   Skin:    General: Skin is warm and dry.     Coloration: Skin is not jaundiced or pale.     Findings: No bruising, erythema, lesion or rash.   Neurological:     General: No focal deficit present.     Mental Status: He is alert and oriented to person, place, and time.   Psychiatric:        Mood and Affect: Mood normal.        Behavior: Behavior normal.    Assessment and Plan:  KORIN HARTWELL is a 27 y.o. male presenting to the [facility] Department for STI screening  Screening examination for venereal disease -     Chlamydia/GC NAA, Confirmation -     Syphilis Serology, [place] -     HIV [facility] -     HBV Antigen/Antibody [facility] -     Ambulatory referral to Behavioral Health  Patient does not have STI symptoms Patient accepted the following screenings: urine CT/GC, HIV, RPR, and Hep B Patient meets criteria for HepB screening? Yes. Ordered? yes Patient meets criteria for HepC screening? No. Ordered? not applicable Recommended condom use with all sex Discussed importance of condom use for STI prevention  Treat positive test results per standing order. Discussed time line for State Lab results and that patient will be called with positive results and encouraged patient to call if he had not heard in 2 weeks Recommended repeat testing in 3 months with positive  results. Recommended returning for continued or worsening symptoms.   No follow-ups on file.  No future appointments.  Jack Marts, MD

## 2024-04-18 NOTE — Patient Instructions (Signed)
 STI screening - Today we obtained a urine sample to screen for gonorrhea and chlamydia - We also obtained a blood sample to screen for HIV, syphilis, and Hepatitis B - If the results are abnormal, I will give you a call.    Estimated time frame for results collected at the [facility] Department: Same day Trichomonas Yeast BV (bacterial vaginosis)  Within 1-2 weeks Gonorrhea Chlamydia  Within 2-3 weeks HIV Syphilis Hepatitis B Hepatitis C    Therapy You can use the following website to start looking for a therapist. Remember, finding a therapist you click with it a lot like speed dating - you have to sort through a bunch of lemons before finding what you like!  Nyle Belling, LCSW Licensed Clinical Social Worker at [facility] Department  Phone: [phone] [address], [address], [place] [address]  [email or web address] Access to care line (24/7) [phone] Treatment information  Crisis assistance  Mobile crisis team connection Mental health needs  Substance use disorder  Intellectual and developmental disabilities  [facility] For children and teens who have experienced the death of a loved one Phone: [phone] [address] [place] [address] [email or web address]  [email or web address]  Search for [place], [place] (or city of your choice).  On the next page, you will see filter options at the top.  You may filter therapists by insurance they take, issues you would like to work on, or therapy types.   [email or web address]     Smoking cessation resources Quit Smoking Hotline:  800-QUIT-NOW 612 173 0408)

## 2024-04-19 LAB — HBV ANTIGEN/ANTIBODY STATE LAB
Hep B Core Total Ab: NONREACTIVE
Hep B S Ab: NONREACTIVE
Hepatitis B Surface Antigen: NONREACTIVE

## 2024-04-21 LAB — CHLAMYDIA/GC NAA, CONFIRMATION
Chlamydia trachomatis, NAA: NEGATIVE
Neisseria gonorrhoeae, NAA: NEGATIVE

## 2024-04-29 ENCOUNTER — Encounter: Payer: Self-pay | Admitting: Family Medicine

## 2024-05-13 ENCOUNTER — Telehealth: Payer: Self-pay | Admitting: Licensed Clinical Social Worker

## 2024-05-13 NOTE — Telephone Encounter (Signed)
 LCSW attempted to call patient regarding provider need to reschedule appointment for 05/15/24. LCSW left voice message informing patient of the need to reschedule and provided alternative times, including 07/09 at 1030am or 11am or on 07/10 at 2pm.

## 2024-05-15 ENCOUNTER — Ambulatory Visit: Payer: Self-pay | Admitting: Licensed Clinical Social Worker

## 2024-07-09 ENCOUNTER — Ambulatory Visit: Payer: Self-pay
